# Patient Record
Sex: Female | Born: 1970 | Race: White | Hispanic: No | Marital: Married | Smoking: Never smoker
Health system: Southern US, Community
[De-identification: ages and names within clinical notes are randomized; demographics above are authoritative.]

## PROBLEM LIST (undated history)

## (undated) DIAGNOSIS — F988 Other specified behavioral and emotional disorders with onset usually occurring in childhood and adolescence: Secondary | ICD-10-CM

## (undated) DIAGNOSIS — E559 Vitamin D deficiency, unspecified: Secondary | ICD-10-CM

## (undated) DIAGNOSIS — N816 Rectocele: Secondary | ICD-10-CM

## (undated) HISTORY — PX: CYST EXCISION: SHX5701

## (undated) HISTORY — DX: Other specified behavioral and emotional disorders with onset usually occurring in childhood and adolescence: F98.8

## (undated) HISTORY — DX: Vitamin D deficiency, unspecified: E55.9

## (undated) HISTORY — PX: WISDOM TOOTH EXTRACTION: SHX21

## (undated) HISTORY — DX: Rectocele: N81.6

---

## 1997-10-27 HISTORY — PX: LASIK: SHX215

## 2004-01-12 ENCOUNTER — Other Ambulatory Visit: Admission: RE | Admit: 2004-01-12 | Discharge: 2004-01-12 | Payer: Self-pay | Admitting: Obstetrics and Gynecology

## 2005-02-21 ENCOUNTER — Other Ambulatory Visit: Admission: RE | Admit: 2005-02-21 | Discharge: 2005-02-21 | Payer: Self-pay | Admitting: Obstetrics and Gynecology

## 2005-09-11 ENCOUNTER — Inpatient Hospital Stay (HOSPITAL_COMMUNITY): Admission: AD | Admit: 2005-09-11 | Discharge: 2005-09-13 | Payer: Self-pay | Admitting: Obstetrics and Gynecology

## 2008-07-04 ENCOUNTER — Inpatient Hospital Stay (HOSPITAL_COMMUNITY): Admission: AD | Admit: 2008-07-04 | Discharge: 2008-07-05 | Payer: Self-pay | Admitting: Obstetrics and Gynecology

## 2009-10-29 ENCOUNTER — Inpatient Hospital Stay (HOSPITAL_COMMUNITY): Admission: AD | Admit: 2009-10-29 | Discharge: 2009-10-30 | Payer: Self-pay | Admitting: Obstetrics and Gynecology

## 2011-01-12 LAB — CBC
HCT: 32.2 % — ABNORMAL LOW (ref 36.0–46.0)
Hemoglobin: 10.8 g/dL — ABNORMAL LOW (ref 12.0–15.0)
Hemoglobin: 9.6 g/dL — ABNORMAL LOW (ref 12.0–15.0)
MCV: 85.3 fL (ref 78.0–100.0)
RBC: 3.36 MIL/uL — ABNORMAL LOW (ref 3.87–5.11)
RBC: 3.81 MIL/uL — ABNORMAL LOW (ref 3.87–5.11)
RDW: 14.1 % (ref 11.5–15.5)
WBC: 8.7 10*3/uL (ref 4.0–10.5)
WBC: 8.7 10*3/uL (ref 4.0–10.5)

## 2011-03-11 NOTE — Discharge Summary (Signed)
NAME:  [name], [name]                 ACCOUNT NO.:  [id number]   MEDICAL RECORD NO.:  [id number]          PATIENT TYPE:  INP   LOCATION:  9115                          FACILITY:  [facility]   PHYSICIAN:  Zenaida Niece, M.D.DATE OF BIRTH:  01/23/1971   DATE OF ADMISSION:  07/04/2008  DATE OF DISCHARGE:                               DISCHARGE SUMMARY   ADMISSION DIAGNOSIS:  Intrauterine pregnancy at 40 weeks.   DISCHARGE DIAGNOSIS:  Intrauterine pregnancy at 40 weeks.   PROCEDURES:  On July 04, 2008, she had a spontaneous vaginal  delivery.   HISTORY AND PHYSICAL:  This is a 40 year old gravida 2, para 1-0-0-1  with an EGA of 40 plus weeks who presents for elective induction.  On  admission, she is starting to have regular contractions on her own.  Prenatal care has been uncomplicated.   PRENATAL LABORATORY DATA:  Blood type is O positive with a negative  antibody screen, RPR nonreactive, rubella immune, hepatitis B surface  antigen negative, HIV negative, gonorrhea and chlamydia negative, and  cystic fibrosis negative.  First trimester screen normal.  MSAFP is  normal.  One-hour Glucola is 111.  Group B strep is negative.   PAST OB HISTORY:  In 2006 vaginal delivery at 40 weeks, 8 pounds, no  complications.   PAST MEDICAL HISTORY:  Remote history of depression and scoliosis.   PHYSICAL EXAMINATION:  GENERAL:  She is afebrile with stable vital  signs.  Fetal heart tracing reactive with contractions every 3-5  minutes.  ABDOMEN:  Gravid, nontender with an estimated fetal weight of 8 pounds.  PELVIC:  Cervix is 3+, 15, -2 vertex presentation, adequate pelvis and  membranes were ruptured revealing clear fluid.   HOSPITAL COURSE:  The patient was admitted for induction and was  probably in early labor.  Membranes were ruptured for augmentation.  She  received an epidural, progressed rapidly and within approximately an  hour of rupture of membranes, had a vaginal delivery of a  viable female  infant with Apgars of 8 and 9, weight 7 pounds 9 ounces.  Placenta  delivered spontaneously, was intact and was sent for cord blood  donation.  She had a small second-degree laceration repaired with 3-0  Vicryl and estimated blood loss was less than 500 mL.  Postpartum, she  had no significant complications.  On postpartum #1, she requested  discharge home.  Predelivery hemoglobin 12.1 and postdelivery 10.4.  She  was felt to be stable enough for discharge home.   DISCHARGE INSTRUCTIONS:  Regular diet, pelvic rest, and followup in 6  weeks.  Medications are over-the-counter ibuprofen as needed and she was  given our discharge pamphlet.      Zenaida Niece, M.D.  Electronically Signed     TDM/MEDQ  D:  07/05/2008  T:  07/05/2008  Job:  [id number]

## 2011-03-14 NOTE — Discharge Summary (Signed)
[name] [name], [name] [name]                 ACCOUNT NO.:  [id number]   MEDICAL RECORD NO.:  [id number]          PATIENT TYPE:  INP   LOCATION:  9125                          FACILITY:  [facility]   PHYSICIAN:  Huel Cote, M.D. DATE OF BIRTH:  1971-03-23   DATE OF ADMISSION:  09/11/2005  DATE OF DISCHARGE:  09/13/2005                                 DISCHARGE SUMMARY   DISCHARGE DIAGNOSES:  1.  Term pregnancy at 40+ weeks, delivered.  2.  Status post normal spontaneous vaginal delivery.   DISCHARGE MEDICATIONS:  1.  Motrin 600 mg p.o. every six hours.  2.  Percocet 1-2 tablets p.o. every four hours p.r.n.   DISCHARGE FOLLOWUP:  The patient is to follow up in the office in six weeks  for routine postpartum exam.   HOSPITAL COURSE:  The patient is a 40 year old, G1 P0, who was admitted at  65+ weeks' gestation in early labor.  Cervical exam, on admission, was 180  and -2.  She was admitted and had to wait at maternity admissions for a  room, at which point she received Stadol and received an epidural at that  point.  She progressed well on her own and reached 5-cm of dilation after  arriving to labor and delivery with spontaneous rupture of membranes.  Prenatal care being complicated by advanced maternal age with a normal first  trimester screen.  She did have borderline ventriculomegaly, however, this  was normal on a followup ultrasound.  Prenatal care, otherwise, was  uncomplicated.   PRENATAL LABORATORY:  As follows:  O+.  Antibody negative.  RPR nonreactive.  Rubella immune.  Hepatitis B surface antigen negative.  HIV negative.  GC  negative.  Chlamydia negative.  A 1-hour Glucola 105.  Group B strep  negative.   PAST MEDICAL HISTORY:  1.  She has a history of scoliosis.  2.  Depression.   She had of possible aspirin intolerance and was taking no medications on  arrival.   PHYSICAL EXAMINATION:  VITAL SIGNS:  She was afebrile with stable vital  signs.  PELVIC:  Cervix was  5-to-6, 80, and a 0 station on her first exam by Dr.  Jackelyn Knife at 8:00 a.m.  She continued to progress, eventually reaching  complete dilation and pushed well with a normal spontaneous vaginal delivery  of a viable female infant.  Apgar's were 8 and 9.  Weight was 8 pounds even.  She did have a second-degree midline episiotomy.  Placenta delivered  spontaneous and intact and was sent for cord blood collection.   Her episiotomy was repaired with 3-0 Vicryl with a local block.   She was then admitted for routine postpartum care.  On postpartum day #1,  she was doing very well.  Her hemoglobin was 9.5 down from 11.4.  She then  continued to do well and on postpartum day #2, was felt stable for discharge  home.      Huel Cote, M.D.  Electronically Signed     KR/MEDQ  D:  09/13/2005  T:  09/13/2005  Job:  [id number]

## 2011-05-05 ENCOUNTER — Other Ambulatory Visit (HOSPITAL_COMMUNITY): Payer: Self-pay | Admitting: Obstetrics and Gynecology

## 2011-05-05 DIAGNOSIS — Z1231 Encounter for screening mammogram for malignant neoplasm of breast: Secondary | ICD-10-CM

## 2011-05-09 ENCOUNTER — Ambulatory Visit (HOSPITAL_COMMUNITY)
Admission: RE | Admit: 2011-05-09 | Discharge: 2011-05-09 | Disposition: A | Discharge status: Home / Self Care | Payer: BC Managed Care – PPO | Source: Ambulatory Visit | Attending: Obstetrics and Gynecology | Admitting: Obstetrics and Gynecology

## 2011-05-09 DIAGNOSIS — Z1231 Encounter for screening mammogram for malignant neoplasm of breast: Secondary | ICD-10-CM | POA: Insufficient documentation

## 2011-07-30 LAB — CBC
HCT: 31.2 — ABNORMAL LOW
HCT: 36.4
Hemoglobin: 10.4 — ABNORMAL LOW
MCHC: 33.1
MCHC: 33.4
MCV: 87
Platelets: 215
Platelets: 242
RBC: 4.19
RDW: 13.7
WBC: 8.6

## 2012-11-12 ENCOUNTER — Other Ambulatory Visit: Payer: Self-pay | Admitting: Obstetrics and Gynecology

## 2012-11-12 DIAGNOSIS — N6320 Unspecified lump in the left breast, unspecified quadrant: Secondary | ICD-10-CM

## 2012-11-26 ENCOUNTER — Ambulatory Visit
Admission: RE | Admit: 2012-11-26 | Discharge: 2012-11-26 | Disposition: A | Discharge status: Home / Self Care | Payer: 59 | Source: Ambulatory Visit | Attending: Obstetrics and Gynecology | Admitting: Obstetrics and Gynecology

## 2012-11-26 DIAGNOSIS — N6320 Unspecified lump in the left breast, unspecified quadrant: Secondary | ICD-10-CM

## 2014-02-19 DIAGNOSIS — F988 Other specified behavioral and emotional disorders with onset usually occurring in childhood and adolescence: Secondary | ICD-10-CM | POA: Insufficient documentation

## 2014-02-19 DIAGNOSIS — E559 Vitamin D deficiency, unspecified: Secondary | ICD-10-CM | POA: Insufficient documentation

## 2014-02-20 ENCOUNTER — Ambulatory Visit (INDEPENDENT_AMBULATORY_CARE_PROVIDER_SITE_OTHER): Payer: Managed Care, Other (non HMO) | Admitting: Emergency Medicine

## 2014-02-20 ENCOUNTER — Encounter: Payer: Self-pay | Admitting: Emergency Medicine

## 2014-02-20 VITALS — BP 116/72 | HR 76 | Temp 98.4°F | Resp 18 | Ht 66.0 in | Wt 131.0 lb

## 2014-02-20 DIAGNOSIS — F988 Other specified behavioral and emotional disorders with onset usually occurring in childhood and adolescence: Secondary | ICD-10-CM

## 2014-02-20 MED ORDER — LISDEXAMFETAMINE DIMESYLATE 60 MG PO CAPS: 60.0000 mg | ORAL_CAPSULE | ORAL | Status: DC

## 2014-02-20 NOTE — Progress Notes (Signed)
   Subjective:    Patient ID: [name] [name], female    DOB: August 24, 1971, 43 y.o.   MRN: [id number]  HPI Comments: 43 yo pleasant WF for ADD f/u she notes she doing well overall with medication. She does get headache if does not eat every couple hours with medicine. She notes it helps with focus at work and home. She is sleeping well but still with mild fatigue and notes that may be from stress. She notes mild stress with work and busy family but is managing well. She notes she is not eating healthy and has restarted her DR Samson Frederic. She notes she is overdue for her female exam but did have negative mammo last year with breast tenderness that may have been associated with caffeine.       Medication List       This list is accurate as of: 02/20/14  4:18 PM.  Always use your most recent med list.               lisdexamfetamine 60 MG capsule  Commonly known as:  VYVANSE  Take 60 mg by mouth every morning.       No Known Allergies  Past Medical History  Diagnosis Date  . ADD (attention deficit disorder)   . Vitamin D deficiency     Review of Systems  Constitutional: Positive for fatigue.  All other systems reviewed and are negative.  BP 116/72  Pulse 76  Temp(Src) 98.4 F (36.9 C) (Temporal)  Resp 18  Ht 5\' 6"  (1.676 m)  Wt 131 lb (59.421 kg)  BMI 21.15 kg/m2  LMP 02/06/2014     Objective:   Physical Exam  Nursing note and vitals reviewed. Constitutional: She is oriented to person, place, and time. She appears well-developed and well-nourished. No distress.  HENT:  Head: Normocephalic and atraumatic.  Right Ear: External ear normal.  Left Ear: External ear normal.  Nose: Nose normal.  Mouth/Throat: Oropharynx is clear and moist.  Eyes: Conjunctivae and EOM are normal.  Neck: Normal range of motion. Neck supple. No JVD present. No thyromegaly present.  Cardiovascular: Normal rate, regular rhythm, normal heart sounds and intact distal pulses.   Pulmonary/Chest: Effort  normal and breath sounds normal.  Abdominal: Soft. There is no tenderness.  Musculoskeletal: Normal range of motion. She exhibits no edema and no tenderness.  Lymphadenopathy:    She has no cervical adenopathy.  Neurological: She is alert and oriented to person, place, and time. No cranial nerve deficit.  Skin: Skin is warm and dry. No rash noted. No erythema. No pallor.  Psychiatric: She has a normal mood and affect. Her behavior is normal. Judgment and thought content normal.          Assessment & Plan:  1. ADD- Refill Vyvanse 60 mg continue AD 3 months of RX given.  2. Stress vs Fatigue- declines labs, increase activity and H2O W/C when ready for CBC/ TSH/ CMET, w/c if symptoms increase

## 2014-02-20 NOTE — Patient Instructions (Signed)
Attention Deficit Hyperactivity Disorder Attention deficit hyperactivity disorder (ADHD) is a problem with behavior issues based on the way the brain functions (neurobehavioral disorder). It is a common reason for behavior and academic problems in school. SYMPTOMS  There are 3 types of ADHD. The 3 types and some of the symptoms include:  Inattentive  Gets bored or distracted easily.  Loses or forgets things. Forgets to hand in homework.  Has trouble organizing or completing tasks.  Difficulty staying on task.  An inability to organize daily tasks and school work.  Leaving projects, chores, or homework unfinished.  Trouble paying attention or responding to details. Careless mistakes.  Difficulty following directions. Often seems like is not listening.  Dislikes activities that require sustained attention (like chores or homework).  Hyperactive-impulsive  Feels like it is impossible to sit still or stay in a seat. Fidgeting with hands and feet.  Trouble waiting turn.  Talking too much or out of turn. Interruptive.  Speaks or acts impulsively.  Aggressive, disruptive behavior.  Constantly busy or on the go, noisy.  Often leaves seat when they are expected to remain seated.  Often runs or climbs where it is not appropriate, or feels very restless.  Combined  Has symptoms of both of the above. Often children with ADHD feel discouraged about themselves and with school. They often perform well below their abilities in school. As children get older, the excess motor activities can calm down, but the problems with paying attention and staying organized persist. Most children do not outgrow ADHD but with good treatment can learn to cope with the symptoms. DIAGNOSIS  When ADHD is suspected, the diagnosis should be made by professionals trained in ADHD. This professional will collect information about the individual suspected of having ADHD. Information must be collected from  various settings where the person lives, works, or attends school.  Diagnosis will include:  Confirming symptoms began in childhood.  Ruling out other reasons for the child's behavior.  The health care providers will check with the child's school and check their medical records.  They will talk to teachers and parents.  Behavior rating scales for the child will be filled out by those dealing with the child on a daily basis. A diagnosis is made only after all information has been considered. TREATMENT  Treatment usually includes behavioral treatment, tutoring or extra support in school, and stimulant medicines. Because of the way a person's brain works with ADHD, these medicines decrease impulsivity and hyperactivity and increase attention. This is different than how they would work in a person who does not have ADHD. Other medicines used include antidepressants and certain blood pressure medicines. Most experts agree that treatment for ADHD should address all aspects of the person's functioning. Along with medicines, treatment should include structured classroom management at school. Parents should reward good behavior, provide constant discipline, and limit-setting. Tutoring should be available for the child as needed. ADHD is a life-long condition. If untreated, the disorder can have long-term serious effects into adolescence and adulthood. HOME CARE INSTRUCTIONS   Often with ADHD there is a lot of frustration among family members dealing with the condition. Blame and anger are also feelings that are common. In many cases, because the problem affects the family as a whole, the entire family may need help. A therapist can help the family find better ways to handle the disruptive behaviors of the person with ADHD and promote change. If the person with ADHD is young, most of the therapist's work   is with the parents. Parents will learn techniques for coping with and improving their child's  behavior. Sometimes only the child with the ADHD needs counseling. Your health care providers can help you make these decisions.  Children with ADHD may need help learning how to organize. Some helpful tips include:  Keep routines the same every day from wake-up time to bedtime. Schedule all activities, including homework and playtime. Keep the schedule in a place where the person with ADHD will often see it. Mark schedule changes as far in advance as possible.  Schedule outdoor and indoor recreation.  Have a place for everything and keep everything in its place. This includes clothing, backpacks, and school supplies.  Encourage writing down assignments and bringing home needed books. Work with your child's teachers for assistance in organizing school work.  Offer your child a well-balanced diet. Breakfast that includes a balance of whole grains, protein and, fruits or vegetables is especially important for school performance. Children should avoid drinks with caffeine including:  Soft drinks.  Coffee.  Tea.  However, some older children (adolescents) may find these drinks helpful in improving their attention. Because it can also be common for adolescents with ADHD to become addicted to caffeine, talk with your health care provider about what is a safe amount of caffeine intake for your child.  Children with ADHD need consistent rules that they can understand and follow. If rules are followed, give small rewards. Children with ADHD often receive, and expect, criticism. Look for good behavior and praise it. Set realistic goals. Give clear instructions. Look for activities that can foster success and self-esteem. Make time for pleasant activities with your child. Give lots of affection.  Parents are their children's greatest advocates. Learn as much as possible about ADHD. This helps you become a stronger and better advocate for your child. It also helps you educate your child's teachers and  instructors if they feel inadequate in these areas. Parent support groups are often helpful. A national group with local chapters is called Children and Adults with Attention Deficit Hyperactivity Disorder (CHADD). SEEK MEDICAL CARE IF:  Your child has repeated muscle twitches, cough or speech outbursts.  Your child has sleep problems.  Your child has a marked loss of appetite.  Your child develops depression.  Your child has new or worsening behavioral problems.  Your child develops dizziness.  Your child has a racing heart.  Your child has stomach pains.  Your child develops headaches. SEEK IMMEDIATE MEDICAL CARE IF:  Your child has been diagnosed with depression or anxiety and the symptoms seem to be getting worse.  Your child has been depressed and suddenly appears to have increased energy or motivation.  You are worried that your child is having a bad reaction to a medication he or she is taking for ADHD. Document Released: 10/03/2002 Document Revised: 08/03/2013 Document Reviewed: 06/20/2013 Exit[employer] Patient Information 2014 [employer].  

## 2014-05-10 ENCOUNTER — Other Ambulatory Visit: Payer: Self-pay | Admitting: Emergency Medicine

## 2014-05-10 MED ORDER — LISDEXAMFETAMINE DIMESYLATE 60 MG PO CAPS: 60.0000 mg | ORAL_CAPSULE | ORAL | Status: DC

## 2014-05-10 MED ORDER — LISDEXAMFETAMINE DIMESYLATE 60 MG PO CAPS: 60.0000 mg | ORAL_CAPSULE | ORAL | Status: AC

## 2014-06-26 ENCOUNTER — Telehealth: Payer: Self-pay | Admitting: Internal Medicine

## 2014-06-28 ENCOUNTER — Encounter: Payer: Self-pay | Admitting: Emergency Medicine

## 2014-07-10 NOTE — Telephone Encounter (Signed)
CLOSED

## 2015-02-06 ENCOUNTER — Telehealth: Payer: Self-pay | Admitting: Emergency Medicine

## 2015-02-06 NOTE — Telephone Encounter (Signed)
Patient called with increased Sore throat with blisters/ fever/ aches and failure of old Amoxicillin to treat strep. Quick visit performed with erythematous tonsils with ulcerative lesions right > left. TMs cloudy. Appears sick. Advised will add ZPAK for strep coverage but could also be Influenza. Advised add Theraflu AD OTC. Follow up for full visit if no improvement. Add probiotic to protect stomach. Push fluids. ER if symptoms increase.

## 2016-03-07 DIAGNOSIS — Z6823 Body mass index (BMI) 23.0-23.9, adult: Secondary | ICD-10-CM | POA: Diagnosis not present

## 2016-03-07 DIAGNOSIS — F900 Attention-deficit hyperactivity disorder, predominantly inattentive type: Secondary | ICD-10-CM | POA: Diagnosis not present

## 2016-06-06 DIAGNOSIS — F900 Attention-deficit hyperactivity disorder, predominantly inattentive type: Secondary | ICD-10-CM | POA: Diagnosis not present

## 2016-09-08 DIAGNOSIS — F909 Attention-deficit hyperactivity disorder, unspecified type: Secondary | ICD-10-CM | POA: Diagnosis not present

## 2016-09-08 DIAGNOSIS — Z23 Encounter for immunization: Secondary | ICD-10-CM | POA: Diagnosis not present

## 2016-12-11 DIAGNOSIS — Z13220 Encounter for screening for lipoid disorders: Secondary | ICD-10-CM | POA: Diagnosis not present

## 2016-12-11 DIAGNOSIS — Z0000 Encounter for general adult medical examination without abnormal findings: Secondary | ICD-10-CM | POA: Diagnosis not present

## 2016-12-15 ENCOUNTER — Other Ambulatory Visit: Payer: Self-pay | Admitting: Family Medicine

## 2016-12-15 DIAGNOSIS — Z6824 Body mass index (BMI) 24.0-24.9, adult: Secondary | ICD-10-CM | POA: Diagnosis not present

## 2016-12-15 DIAGNOSIS — Z0000 Encounter for general adult medical examination without abnormal findings: Secondary | ICD-10-CM | POA: Diagnosis not present

## 2016-12-15 DIAGNOSIS — Z23 Encounter for immunization: Secondary | ICD-10-CM | POA: Diagnosis not present

## 2016-12-15 DIAGNOSIS — Z1231 Encounter for screening mammogram for malignant neoplasm of breast: Secondary | ICD-10-CM

## 2016-12-22 ENCOUNTER — Ambulatory Visit: Payer: Self-pay

## 2017-01-05 ENCOUNTER — Ambulatory Visit
Admission: RE | Admit: 2017-01-05 | Discharge: 2017-01-05 | Disposition: A | Discharge status: Home / Self Care | Payer: BLUE CROSS/BLUE SHIELD | Source: Ambulatory Visit | Attending: Family Medicine | Admitting: Family Medicine

## 2017-01-05 DIAGNOSIS — Z1231 Encounter for screening mammogram for malignant neoplasm of breast: Secondary | ICD-10-CM

## 2017-03-16 DIAGNOSIS — Z6825 Body mass index (BMI) 25.0-25.9, adult: Secondary | ICD-10-CM | POA: Diagnosis not present

## 2017-03-16 DIAGNOSIS — F331 Major depressive disorder, recurrent, moderate: Secondary | ICD-10-CM | POA: Diagnosis not present

## 2017-03-16 DIAGNOSIS — F900 Attention-deficit hyperactivity disorder, predominantly inattentive type: Secondary | ICD-10-CM | POA: Diagnosis not present

## 2017-03-16 DIAGNOSIS — F3281 Premenstrual dysphoric disorder: Secondary | ICD-10-CM | POA: Diagnosis not present

## 2017-06-15 DIAGNOSIS — Z23 Encounter for immunization: Secondary | ICD-10-CM | POA: Diagnosis not present

## 2017-06-15 DIAGNOSIS — F900 Attention-deficit hyperactivity disorder, predominantly inattentive type: Secondary | ICD-10-CM | POA: Diagnosis not present

## 2017-07-13 DIAGNOSIS — F4322 Adjustment disorder with anxiety: Secondary | ICD-10-CM | POA: Diagnosis not present

## 2017-07-27 DIAGNOSIS — F4322 Adjustment disorder with anxiety: Secondary | ICD-10-CM | POA: Diagnosis not present

## 2017-08-17 DIAGNOSIS — F4322 Adjustment disorder with anxiety: Secondary | ICD-10-CM | POA: Diagnosis not present

## 2017-09-14 DIAGNOSIS — F4322 Adjustment disorder with anxiety: Secondary | ICD-10-CM | POA: Diagnosis not present

## 2017-09-14 DIAGNOSIS — Z23 Encounter for immunization: Secondary | ICD-10-CM | POA: Diagnosis not present

## 2017-09-14 DIAGNOSIS — F3281 Premenstrual dysphoric disorder: Secondary | ICD-10-CM | POA: Diagnosis not present

## 2017-09-14 DIAGNOSIS — F331 Major depressive disorder, recurrent, moderate: Secondary | ICD-10-CM | POA: Diagnosis not present

## 2017-09-14 DIAGNOSIS — Z6823 Body mass index (BMI) 23.0-23.9, adult: Secondary | ICD-10-CM | POA: Diagnosis not present

## 2017-09-14 DIAGNOSIS — F900 Attention-deficit hyperactivity disorder, predominantly inattentive type: Secondary | ICD-10-CM | POA: Diagnosis not present

## 2017-10-12 DIAGNOSIS — F4322 Adjustment disorder with anxiety: Secondary | ICD-10-CM | POA: Diagnosis not present

## 2017-11-02 DIAGNOSIS — F4322 Adjustment disorder with anxiety: Secondary | ICD-10-CM | POA: Diagnosis not present

## 2017-11-16 DIAGNOSIS — F4322 Adjustment disorder with anxiety: Secondary | ICD-10-CM | POA: Diagnosis not present

## 2017-11-30 DIAGNOSIS — F4322 Adjustment disorder with anxiety: Secondary | ICD-10-CM | POA: Diagnosis not present

## 2017-12-14 DIAGNOSIS — F900 Attention-deficit hyperactivity disorder, predominantly inattentive type: Secondary | ICD-10-CM | POA: Diagnosis not present

## 2017-12-14 DIAGNOSIS — Z6826 Body mass index (BMI) 26.0-26.9, adult: Secondary | ICD-10-CM | POA: Diagnosis not present

## 2017-12-21 DIAGNOSIS — Z13220 Encounter for screening for lipoid disorders: Secondary | ICD-10-CM | POA: Diagnosis not present

## 2017-12-21 DIAGNOSIS — Z1329 Encounter for screening for other suspected endocrine disorder: Secondary | ICD-10-CM | POA: Diagnosis not present

## 2017-12-21 DIAGNOSIS — Z0000 Encounter for general adult medical examination without abnormal findings: Secondary | ICD-10-CM | POA: Diagnosis not present

## 2017-12-21 DIAGNOSIS — Z114 Encounter for screening for human immunodeficiency virus [HIV]: Secondary | ICD-10-CM | POA: Diagnosis not present

## 2017-12-21 DIAGNOSIS — F4322 Adjustment disorder with anxiety: Secondary | ICD-10-CM | POA: Diagnosis not present

## 2017-12-21 DIAGNOSIS — E559 Vitamin D deficiency, unspecified: Secondary | ICD-10-CM | POA: Diagnosis not present

## 2017-12-28 DIAGNOSIS — Z0000 Encounter for general adult medical examination without abnormal findings: Secondary | ICD-10-CM | POA: Diagnosis not present

## 2017-12-28 DIAGNOSIS — Z6825 Body mass index (BMI) 25.0-25.9, adult: Secondary | ICD-10-CM | POA: Diagnosis not present

## 2018-01-04 DIAGNOSIS — F4322 Adjustment disorder with anxiety: Secondary | ICD-10-CM | POA: Diagnosis not present

## 2018-01-07 ENCOUNTER — Other Ambulatory Visit: Payer: Self-pay | Admitting: Family Medicine

## 2018-01-07 DIAGNOSIS — Z1231 Encounter for screening mammogram for malignant neoplasm of breast: Secondary | ICD-10-CM

## 2018-01-18 DIAGNOSIS — F4322 Adjustment disorder with anxiety: Secondary | ICD-10-CM | POA: Diagnosis not present

## 2018-02-01 DIAGNOSIS — F4322 Adjustment disorder with anxiety: Secondary | ICD-10-CM | POA: Diagnosis not present

## 2018-02-15 DIAGNOSIS — F4322 Adjustment disorder with anxiety: Secondary | ICD-10-CM | POA: Diagnosis not present

## 2018-03-01 DIAGNOSIS — F4322 Adjustment disorder with anxiety: Secondary | ICD-10-CM | POA: Diagnosis not present

## 2018-03-15 DIAGNOSIS — F4322 Adjustment disorder with anxiety: Secondary | ICD-10-CM | POA: Diagnosis not present

## 2018-03-29 DIAGNOSIS — F4322 Adjustment disorder with anxiety: Secondary | ICD-10-CM | POA: Diagnosis not present

## 2018-03-29 DIAGNOSIS — F900 Attention-deficit hyperactivity disorder, predominantly inattentive type: Secondary | ICD-10-CM | POA: Diagnosis not present

## 2018-03-29 DIAGNOSIS — Z6826 Body mass index (BMI) 26.0-26.9, adult: Secondary | ICD-10-CM | POA: Diagnosis not present

## 2018-04-19 DIAGNOSIS — F4322 Adjustment disorder with anxiety: Secondary | ICD-10-CM | POA: Diagnosis not present

## 2018-05-10 DIAGNOSIS — F4322 Adjustment disorder with anxiety: Secondary | ICD-10-CM | POA: Diagnosis not present

## 2018-05-24 DIAGNOSIS — F4322 Adjustment disorder with anxiety: Secondary | ICD-10-CM | POA: Diagnosis not present

## 2018-05-31 DIAGNOSIS — E559 Vitamin D deficiency, unspecified: Secondary | ICD-10-CM | POA: Diagnosis not present

## 2018-06-07 DIAGNOSIS — F900 Attention-deficit hyperactivity disorder, predominantly inattentive type: Secondary | ICD-10-CM | POA: Diagnosis not present

## 2018-06-07 DIAGNOSIS — E559 Vitamin D deficiency, unspecified: Secondary | ICD-10-CM | POA: Diagnosis not present

## 2018-06-07 DIAGNOSIS — Z6826 Body mass index (BMI) 26.0-26.9, adult: Secondary | ICD-10-CM | POA: Diagnosis not present

## 2018-06-07 DIAGNOSIS — F4322 Adjustment disorder with anxiety: Secondary | ICD-10-CM | POA: Diagnosis not present

## 2018-06-21 DIAGNOSIS — F4322 Adjustment disorder with anxiety: Secondary | ICD-10-CM | POA: Diagnosis not present

## 2018-07-05 DIAGNOSIS — F4322 Adjustment disorder with anxiety: Secondary | ICD-10-CM | POA: Diagnosis not present

## 2018-07-19 DIAGNOSIS — F4322 Adjustment disorder with anxiety: Secondary | ICD-10-CM | POA: Diagnosis not present

## 2018-08-23 DIAGNOSIS — F4322 Adjustment disorder with anxiety: Secondary | ICD-10-CM | POA: Diagnosis not present

## 2018-09-06 DIAGNOSIS — F4322 Adjustment disorder with anxiety: Secondary | ICD-10-CM | POA: Diagnosis not present

## 2018-09-20 DIAGNOSIS — F4322 Adjustment disorder with anxiety: Secondary | ICD-10-CM | POA: Diagnosis not present

## 2018-10-04 DIAGNOSIS — F4322 Adjustment disorder with anxiety: Secondary | ICD-10-CM | POA: Diagnosis not present

## 2018-10-11 DIAGNOSIS — F4322 Adjustment disorder with anxiety: Secondary | ICD-10-CM | POA: Diagnosis not present

## 2018-11-08 DIAGNOSIS — F4322 Adjustment disorder with anxiety: Secondary | ICD-10-CM | POA: Diagnosis not present

## 2018-11-10 DIAGNOSIS — F900 Attention-deficit hyperactivity disorder, predominantly inattentive type: Secondary | ICD-10-CM | POA: Diagnosis not present

## 2018-11-10 DIAGNOSIS — Z6825 Body mass index (BMI) 25.0-25.9, adult: Secondary | ICD-10-CM | POA: Diagnosis not present

## 2018-11-22 DIAGNOSIS — F4322 Adjustment disorder with anxiety: Secondary | ICD-10-CM | POA: Diagnosis not present

## 2018-12-06 DIAGNOSIS — F4322 Adjustment disorder with anxiety: Secondary | ICD-10-CM | POA: Diagnosis not present

## 2018-12-20 DIAGNOSIS — F4322 Adjustment disorder with anxiety: Secondary | ICD-10-CM | POA: Diagnosis not present

## 2018-12-27 DIAGNOSIS — Z114 Encounter for screening for human immunodeficiency virus [HIV]: Secondary | ICD-10-CM | POA: Diagnosis not present

## 2018-12-27 DIAGNOSIS — Z1329 Encounter for screening for other suspected endocrine disorder: Secondary | ICD-10-CM | POA: Diagnosis not present

## 2018-12-27 DIAGNOSIS — E559 Vitamin D deficiency, unspecified: Secondary | ICD-10-CM | POA: Diagnosis not present

## 2018-12-27 DIAGNOSIS — Z13220 Encounter for screening for lipoid disorders: Secondary | ICD-10-CM | POA: Diagnosis not present

## 2018-12-27 DIAGNOSIS — Z0000 Encounter for general adult medical examination without abnormal findings: Secondary | ICD-10-CM | POA: Diagnosis not present

## 2019-01-03 DIAGNOSIS — Z118 Encounter for screening for other infectious and parasitic diseases: Secondary | ICD-10-CM | POA: Diagnosis not present

## 2019-01-03 DIAGNOSIS — Z01419 Encounter for gynecological examination (general) (routine) without abnormal findings: Secondary | ICD-10-CM | POA: Diagnosis not present

## 2019-01-03 DIAGNOSIS — Z1211 Encounter for screening for malignant neoplasm of colon: Secondary | ICD-10-CM | POA: Diagnosis not present

## 2019-01-03 DIAGNOSIS — Z1151 Encounter for screening for human papillomavirus (HPV): Secondary | ICD-10-CM | POA: Diagnosis not present

## 2019-01-03 DIAGNOSIS — Z6826 Body mass index (BMI) 26.0-26.9, adult: Secondary | ICD-10-CM | POA: Diagnosis not present

## 2019-01-03 DIAGNOSIS — Z113 Encounter for screening for infections with a predominantly sexual mode of transmission: Secondary | ICD-10-CM | POA: Diagnosis not present

## 2019-01-03 DIAGNOSIS — Z0000 Encounter for general adult medical examination without abnormal findings: Secondary | ICD-10-CM | POA: Diagnosis not present

## 2019-01-03 DIAGNOSIS — F4322 Adjustment disorder with anxiety: Secondary | ICD-10-CM | POA: Diagnosis not present

## 2019-01-07 ENCOUNTER — Other Ambulatory Visit: Payer: Self-pay | Admitting: Family Medicine

## 2019-01-07 DIAGNOSIS — R5381 Other malaise: Secondary | ICD-10-CM

## 2019-01-13 ENCOUNTER — Other Ambulatory Visit: Payer: Self-pay | Admitting: Family Medicine

## 2019-01-13 DIAGNOSIS — E2839 Other primary ovarian failure: Secondary | ICD-10-CM

## 2019-01-31 DIAGNOSIS — F4322 Adjustment disorder with anxiety: Secondary | ICD-10-CM | POA: Diagnosis not present

## 2019-02-08 DIAGNOSIS — F900 Attention-deficit hyperactivity disorder, predominantly inattentive type: Secondary | ICD-10-CM | POA: Diagnosis not present

## 2019-02-14 DIAGNOSIS — F4322 Adjustment disorder with anxiety: Secondary | ICD-10-CM | POA: Diagnosis not present

## 2019-02-28 DIAGNOSIS — F4322 Adjustment disorder with anxiety: Secondary | ICD-10-CM | POA: Diagnosis not present

## 2019-03-28 DIAGNOSIS — F4322 Adjustment disorder with anxiety: Secondary | ICD-10-CM | POA: Diagnosis not present

## 2019-04-11 DIAGNOSIS — F4322 Adjustment disorder with anxiety: Secondary | ICD-10-CM | POA: Diagnosis not present

## 2019-04-22 DIAGNOSIS — Z20828 Contact with and (suspected) exposure to other viral communicable diseases: Secondary | ICD-10-CM | POA: Diagnosis not present

## 2019-04-22 DIAGNOSIS — R0602 Shortness of breath: Secondary | ICD-10-CM | POA: Diagnosis not present

## 2019-04-22 DIAGNOSIS — R05 Cough: Secondary | ICD-10-CM | POA: Diagnosis not present

## 2019-05-09 DIAGNOSIS — F4322 Adjustment disorder with anxiety: Secondary | ICD-10-CM | POA: Diagnosis not present

## 2019-05-23 DIAGNOSIS — Z23 Encounter for immunization: Secondary | ICD-10-CM | POA: Diagnosis not present

## 2019-05-23 DIAGNOSIS — F900 Attention-deficit hyperactivity disorder, predominantly inattentive type: Secondary | ICD-10-CM | POA: Diagnosis not present

## 2019-05-23 DIAGNOSIS — E559 Vitamin D deficiency, unspecified: Secondary | ICD-10-CM | POA: Diagnosis not present

## 2019-05-30 DIAGNOSIS — F4322 Adjustment disorder with anxiety: Secondary | ICD-10-CM | POA: Diagnosis not present

## 2019-06-27 DIAGNOSIS — F4322 Adjustment disorder with anxiety: Secondary | ICD-10-CM | POA: Diagnosis not present

## 2019-07-25 DIAGNOSIS — F4322 Adjustment disorder with anxiety: Secondary | ICD-10-CM | POA: Diagnosis not present

## 2019-08-22 DIAGNOSIS — F900 Attention-deficit hyperactivity disorder, predominantly inattentive type: Secondary | ICD-10-CM | POA: Diagnosis not present

## 2019-08-29 DIAGNOSIS — F4322 Adjustment disorder with anxiety: Secondary | ICD-10-CM | POA: Diagnosis not present

## 2019-10-03 DIAGNOSIS — F4322 Adjustment disorder with anxiety: Secondary | ICD-10-CM | POA: Diagnosis not present

## 2019-11-14 ENCOUNTER — Other Ambulatory Visit: Payer: BLUE CROSS/BLUE SHIELD

## 2019-11-21 DIAGNOSIS — F331 Major depressive disorder, recurrent, moderate: Secondary | ICD-10-CM | POA: Diagnosis not present

## 2019-11-21 DIAGNOSIS — Z6824 Body mass index (BMI) 24.0-24.9, adult: Secondary | ICD-10-CM | POA: Diagnosis not present

## 2019-11-21 DIAGNOSIS — E559 Vitamin D deficiency, unspecified: Secondary | ICD-10-CM | POA: Diagnosis not present

## 2019-11-21 DIAGNOSIS — F900 Attention-deficit hyperactivity disorder, predominantly inattentive type: Secondary | ICD-10-CM | POA: Diagnosis not present

## 2020-01-31 DIAGNOSIS — E559 Vitamin D deficiency, unspecified: Secondary | ICD-10-CM | POA: Diagnosis not present

## 2020-01-31 DIAGNOSIS — Z20828 Contact with and (suspected) exposure to other viral communicable diseases: Secondary | ICD-10-CM | POA: Diagnosis not present

## 2020-01-31 DIAGNOSIS — Z0184 Encounter for antibody response examination: Secondary | ICD-10-CM | POA: Diagnosis not present

## 2020-01-31 DIAGNOSIS — Z0000 Encounter for general adult medical examination without abnormal findings: Secondary | ICD-10-CM | POA: Diagnosis not present

## 2020-02-02 DIAGNOSIS — Z0000 Encounter for general adult medical examination without abnormal findings: Secondary | ICD-10-CM | POA: Diagnosis not present

## 2020-02-02 DIAGNOSIS — Z1211 Encounter for screening for malignant neoplasm of colon: Secondary | ICD-10-CM | POA: Diagnosis not present

## 2020-02-02 DIAGNOSIS — Z23 Encounter for immunization: Secondary | ICD-10-CM | POA: Diagnosis not present

## 2020-02-02 DIAGNOSIS — Z6826 Body mass index (BMI) 26.0-26.9, adult: Secondary | ICD-10-CM | POA: Diagnosis not present

## 2020-02-20 DIAGNOSIS — F900 Attention-deficit hyperactivity disorder, predominantly inattentive type: Secondary | ICD-10-CM | POA: Diagnosis not present

## 2020-03-23 ENCOUNTER — Encounter: Payer: Self-pay | Admitting: Gastroenterology

## 2020-04-12 DIAGNOSIS — Z23 Encounter for immunization: Secondary | ICD-10-CM | POA: Diagnosis not present

## 2020-05-21 ENCOUNTER — Other Ambulatory Visit: Payer: Self-pay

## 2020-05-21 ENCOUNTER — Ambulatory Visit (AMBULATORY_SURGERY_CENTER): Payer: Self-pay | Admitting: *Deleted

## 2020-05-21 VITALS — Ht 66.0 in | Wt 156.2 lb

## 2020-05-21 DIAGNOSIS — Z1211 Encounter for screening for malignant neoplasm of colon: Secondary | ICD-10-CM

## 2020-05-21 MED ORDER — SUTAB 1479-225-188 MG PO TABS: 1.0000 | ORAL_TABLET | Freq: Once | ORAL | 0 refills | Status: AC

## 2020-05-21 NOTE — Progress Notes (Signed)
covid vaccines completed 12-07-19  Pt is aware that care partner will wait in the car during procedure; if they feel like they will be too hot or cold to wait in the car; they may wait in the 4 th floor lobby. Patient is aware to bring only one care partner. We want them to wear a mask (we do not have any that we can provide them), practice social distancing, and we will check their temperatures when they get here.  I did remind the patient that their care partner needs to stay in the parking lot the entire time and have a cell phone available, we will call them when the pt is ready for discharge. Patient will wear mask into building.   No trouble with anesthesia, difficulty with moving neck or hx/fam hx of malignant hyperthermia per pt   No egg or soy allergy  No home oxygen use   No medications for weight loss taken  emmi information given  Pt denies constipation issues   Sutab code put into RX and paper copy given to pt to show pharmacy

## 2020-05-22 ENCOUNTER — Encounter: Payer: Self-pay | Admitting: Gastroenterology

## 2020-05-28 DIAGNOSIS — F988 Other specified behavioral and emotional disorders with onset usually occurring in childhood and adolescence: Secondary | ICD-10-CM | POA: Diagnosis not present

## 2020-05-28 DIAGNOSIS — F4321 Adjustment disorder with depressed mood: Secondary | ICD-10-CM | POA: Diagnosis not present

## 2020-05-28 DIAGNOSIS — G4700 Insomnia, unspecified: Secondary | ICD-10-CM | POA: Diagnosis not present

## 2020-06-03 ENCOUNTER — Encounter: Payer: Self-pay | Admitting: Certified Registered Nurse Anesthetist

## 2020-06-04 ENCOUNTER — Other Ambulatory Visit: Payer: Self-pay

## 2020-06-04 ENCOUNTER — Ambulatory Visit (AMBULATORY_SURGERY_CENTER): Payer: BC Managed Care – PPO | Admitting: Gastroenterology

## 2020-06-04 ENCOUNTER — Encounter: Payer: Self-pay | Admitting: Gastroenterology

## 2020-06-04 VITALS — BP 122/72 | HR 54 | Temp 96.9°F | Resp 16 | Ht 66.0 in | Wt 156.2 lb

## 2020-06-04 DIAGNOSIS — Z1211 Encounter for screening for malignant neoplasm of colon: Secondary | ICD-10-CM | POA: Diagnosis not present

## 2020-06-04 DIAGNOSIS — D122 Benign neoplasm of ascending colon: Secondary | ICD-10-CM | POA: Diagnosis not present

## 2020-06-04 DIAGNOSIS — D125 Benign neoplasm of sigmoid colon: Secondary | ICD-10-CM | POA: Diagnosis not present

## 2020-06-04 MED ORDER — SODIUM CHLORIDE 0.9 % IV SOLN: 500.0000 mL | Freq: Once | INTRAVENOUS | Status: DC

## 2020-06-04 NOTE — Progress Notes (Signed)
Pt's states no medical or surgical changes since previsit or office visit.  Front desk-LR VS-CW/TB

## 2020-06-04 NOTE — Patient Instructions (Signed)
YOU HAD AN ENDOSCOPIC PROCEDURE TODAY AT [facility]:   Refer to the procedure report that was given to you for any specific questions about what was found during the examination.  If the procedure report does not answer your questions, please call your gastroenterologist to clarify.  If you requested that your care partner not be given the details of your procedure findings, then the procedure report has been included in a sealed envelope for you to review at your convenience later.  YOU SHOULD EXPECT: Some feelings of bloating in the abdomen. Passage of more gas than usual.  Walking can help get rid of the air that was put into your GI tract during the procedure and reduce the bloating. If you had a lower endoscopy (such as a colonoscopy or flexible sigmoidoscopy) you may notice spotting of blood in your stool or on the toilet paper. If you underwent a bowel prep for your procedure, you may not have a normal bowel movement for a few days.  **Handout given on Polyps**  Please Note:  You might notice some irritation and congestion in your nose or some drainage.  This is from the oxygen used during your procedure.  There is no need for concern and it should clear up in a day or so.  SYMPTOMS TO REPORT IMMEDIATELY:   Following lower endoscopy (colonoscopy or flexible sigmoidoscopy):  Excessive amounts of blood in the stool  Significant tenderness or worsening of abdominal pains  Swelling of the abdomen that is new, acute  Fever of 100F or higher   For urgent or emergent issues, a gastroenterologist can be reached at any hour by calling [phone]. Do not use MyChart messaging for urgent concerns.    DIET:  We do recommend a small meal at first, but then you may proceed to your regular diet.  Drink plenty of fluids but you should avoid alcoholic beverages for 24 hours.  ACTIVITY:  You should plan to take it easy for the rest of today and you should NOT DRIVE or use heavy  machinery until tomorrow (because of the sedation medicines used during the test).    FOLLOW UP: Our staff will call the number listed on your records 48-72 hours following your procedure to check on you and address any questions or concerns that you may have regarding the information given to you following your procedure. If we do not reach you, we will leave a message.  We will attempt to reach you two times.  During this call, we will ask if you have developed any symptoms of COVID 19. If you develop any symptoms (ie: fever, flu-like symptoms, shortness of breath, cough etc.) before then, please call [phone].  If you test positive for Covid 19 in the 2 weeks post procedure, please call and report this information to [place].    If any biopsies were taken you will be contacted by phone or by letter within the next 1-3 weeks.  Please call us at [phone] if you have not heard about the biopsies in 3 weeks.    SIGNATURES/CONFIDENTIALITY: You and/or your care partner have signed paperwork which will be entered into your electronic medical record.  These signatures attest to the fact that that the information above on your After Visit Summary has been reviewed and is understood.  Full responsibility of the confidentiality of this discharge information lies with you and/or your care-partner.

## 2020-06-04 NOTE — Op Note (Signed)
[place] Patient Name: [name] [name] Procedure Date: 06/04/2020 9:03 AM MRN: [id number] Endoscopist: Mauri Pole , MD Age: 49 Referring MD:  Date of Birth: 08/30/1971 Gender: Female Account #: [id number] Procedure:                Colonoscopy Indications:              Screening for colorectal malignant neoplasm Medicines:                Monitored Anesthesia Care Procedure:                Pre-Anesthesia Assessment:                           - Prior to the procedure, a History and Physical                            was performed, and patient medications and                            allergies were reviewed. The patient's tolerance of                            previous anesthesia was also reviewed. The risks                            and benefits of the procedure and the sedation                            options and risks were discussed with the patient.                            All questions were answered, and informed consent                            was obtained. Prior Anticoagulants: The patient has                            taken no previous anticoagulant or antiplatelet                            agents. ASA Grade Assessment: II - A patient with                            mild systemic disease. After reviewing the risks                            and benefits, the patient was deemed in                            satisfactory condition to undergo the procedure.                           After obtaining informed consent, the colonoscope  was passed under direct vision. Throughout the                            procedure, the patient's blood pressure, pulse, and                            oxygen saturations were monitored continuously. The                            Colonoscope was introduced through the anus and                            advanced to the the cecum, identified by                            appendiceal orifice and  ileocecal valve. The                            colonoscopy was performed without difficulty. The                            patient tolerated the procedure well. The quality                            of the bowel preparation was good. The ileocecal                            valve, appendiceal orifice, and rectum were                            photographed. Scope In: 9:11:07 AM Scope Out: 9:35:01 AM Scope Withdrawal Time: 0 hours 12 minutes 35 seconds  Total Procedure Duration: 0 hours 23 minutes 54 seconds  Findings:                 The perianal and digital rectal examinations were                            normal.                           A 7 mm polyp was found in the ascending colon. The                            polyp was sessile. The polyp was removed with a                            cold snare. Resection and retrieval were complete.                           A 1 mm polyp was found in the sigmoid colon. The                            polyp was sessile. The polyp was removed with a  cold biopsy forceps. Resection and retrieval were                            complete.                           Scattered small and large-mouthed diverticula were                            found in the sigmoid colon, descending colon and                            ascending colon.                           Non-bleeding internal hemorrhoids were found during                            retroflexion. The hemorrhoids were small. Complications:            No immediate complications. Estimated Blood Loss:     Estimated blood loss was minimal. Impression:               - One 7 mm polyp in the ascending colon, removed                            with a cold snare. Resected and retrieved.                           - One 1 mm polyp in the sigmoid colon, removed with                            a cold biopsy forceps. Resected and retrieved.                           - Mild  diverticulosis in the sigmoid colon, in the                            descending colon and in the ascending colon.                           - Non-bleeding internal hemorrhoids. Recommendation:           - Patient has a contact number available for                            emergencies. The signs and symptoms of potential                            delayed complications were discussed with the                            patient. Return to normal activities tomorrow.                            Written discharge  instructions were provided to the                            patient.                           - Resume previous diet.                           - Continue present medications.                           - Await pathology results.                           - Repeat colonoscopy in 5-10 years for surveillance                            based on pathology results. Mauri Pole, MD 06/04/2020 9:39:47 AM This report has been signed electronically.

## 2020-06-04 NOTE — Progress Notes (Signed)
Called to room to assist during endoscopic procedure.  Patient ID and intended procedure confirmed with present staff. Received instructions for my participation in the procedure from the performing physician.  

## 2020-06-04 NOTE — Progress Notes (Signed)
Report given to PACU, vss 

## 2020-06-06 ENCOUNTER — Telehealth: Payer: Self-pay | Admitting: *Deleted

## 2020-06-06 NOTE — Telephone Encounter (Signed)
  Follow up Call-  Call back number 06/04/2020  Post procedure Call Back phone  # [phone]  Permission to leave phone message Yes  Some recent data might be hidden     Patient questions:  Do you have a fever, pain , or abdominal swelling? No. Pain Score  0 *  Have you tolerated food without any problems? Yes.    Have you been able to return to your normal activities? Yes.    Do you have any questions about your discharge instructions: Diet   No. Medications  No. Follow up visit  No.  Do you have questions or concerns about your Care? No.  Actions: * If pain score is 4 or above: No action needed, pain <4  1. Have you developed a fever since your procedure? NO  2.   Have you had an respiratory symptoms (SOB or cough) since your procedure? NO  3.   Have you tested positive for COVID 19 since your procedure NO  4.   Have you had any family members/close contacts diagnosed with the COVID 19 since your procedure?  NO   If yes to any of these questions please route to Joylene John, RN and Joella Prince, RN

## 2020-06-15 ENCOUNTER — Encounter: Payer: Self-pay | Admitting: Gastroenterology

## 2020-06-18 DIAGNOSIS — Z23 Encounter for immunization: Secondary | ICD-10-CM | POA: Diagnosis not present

## 2020-07-30 DIAGNOSIS — F411 Generalized anxiety disorder: Secondary | ICD-10-CM | POA: Diagnosis not present

## 2020-07-30 DIAGNOSIS — F331 Major depressive disorder, recurrent, moderate: Secondary | ICD-10-CM | POA: Diagnosis not present

## 2020-07-30 DIAGNOSIS — F988 Other specified behavioral and emotional disorders with onset usually occurring in childhood and adolescence: Secondary | ICD-10-CM | POA: Diagnosis not present

## 2020-07-30 DIAGNOSIS — G4700 Insomnia, unspecified: Secondary | ICD-10-CM | POA: Diagnosis not present

## 2020-09-03 DIAGNOSIS — G4700 Insomnia, unspecified: Secondary | ICD-10-CM | POA: Diagnosis not present

## 2020-09-03 DIAGNOSIS — F331 Major depressive disorder, recurrent, moderate: Secondary | ICD-10-CM | POA: Diagnosis not present

## 2020-09-03 DIAGNOSIS — F988 Other specified behavioral and emotional disorders with onset usually occurring in childhood and adolescence: Secondary | ICD-10-CM | POA: Diagnosis not present

## 2020-09-03 DIAGNOSIS — F411 Generalized anxiety disorder: Secondary | ICD-10-CM | POA: Diagnosis not present

## 2020-12-31 DIAGNOSIS — F331 Major depressive disorder, recurrent, moderate: Secondary | ICD-10-CM | POA: Diagnosis not present

## 2020-12-31 DIAGNOSIS — F988 Other specified behavioral and emotional disorders with onset usually occurring in childhood and adolescence: Secondary | ICD-10-CM | POA: Diagnosis not present

## 2020-12-31 DIAGNOSIS — F411 Generalized anxiety disorder: Secondary | ICD-10-CM | POA: Diagnosis not present

## 2020-12-31 DIAGNOSIS — G4700 Insomnia, unspecified: Secondary | ICD-10-CM | POA: Diagnosis not present

## 2021-01-28 DIAGNOSIS — F988 Other specified behavioral and emotional disorders with onset usually occurring in childhood and adolescence: Secondary | ICD-10-CM | POA: Diagnosis not present

## 2021-02-25 DIAGNOSIS — G4700 Insomnia, unspecified: Secondary | ICD-10-CM | POA: Diagnosis not present

## 2021-02-25 DIAGNOSIS — F988 Other specified behavioral and emotional disorders with onset usually occurring in childhood and adolescence: Secondary | ICD-10-CM | POA: Diagnosis not present

## 2021-02-25 DIAGNOSIS — Z0000 Encounter for general adult medical examination without abnormal findings: Secondary | ICD-10-CM | POA: Diagnosis not present

## 2021-02-25 DIAGNOSIS — F411 Generalized anxiety disorder: Secondary | ICD-10-CM | POA: Diagnosis not present

## 2021-02-25 DIAGNOSIS — F331 Major depressive disorder, recurrent, moderate: Secondary | ICD-10-CM | POA: Diagnosis not present

## 2021-02-26 ENCOUNTER — Other Ambulatory Visit: Payer: Self-pay | Admitting: Family Medicine

## 2021-02-26 DIAGNOSIS — Z0000 Encounter for general adult medical examination without abnormal findings: Secondary | ICD-10-CM | POA: Diagnosis not present

## 2021-02-26 DIAGNOSIS — Z01419 Encounter for gynecological examination (general) (routine) without abnormal findings: Secondary | ICD-10-CM | POA: Diagnosis not present

## 2021-02-26 DIAGNOSIS — Z118 Encounter for screening for other infectious and parasitic diseases: Secondary | ICD-10-CM | POA: Diagnosis not present

## 2021-02-26 DIAGNOSIS — Z1231 Encounter for screening mammogram for malignant neoplasm of breast: Secondary | ICD-10-CM

## 2021-02-26 DIAGNOSIS — R030 Elevated blood-pressure reading, without diagnosis of hypertension: Secondary | ICD-10-CM | POA: Diagnosis not present

## 2021-02-26 DIAGNOSIS — Z23 Encounter for immunization: Secondary | ICD-10-CM | POA: Diagnosis not present

## 2021-04-24 ENCOUNTER — Other Ambulatory Visit: Payer: Self-pay

## 2021-04-24 ENCOUNTER — Ambulatory Visit
Admission: RE | Admit: 2021-04-24 | Discharge: 2021-04-24 | Disposition: A | Discharge status: Home / Self Care | Payer: BC Managed Care – PPO | Source: Ambulatory Visit | Attending: Family Medicine | Admitting: Family Medicine

## 2021-04-24 DIAGNOSIS — Z1231 Encounter for screening mammogram for malignant neoplasm of breast: Secondary | ICD-10-CM | POA: Diagnosis not present

## 2021-05-27 DIAGNOSIS — F988 Other specified behavioral and emotional disorders with onset usually occurring in childhood and adolescence: Secondary | ICD-10-CM | POA: Diagnosis not present

## 2021-05-27 DIAGNOSIS — N926 Irregular menstruation, unspecified: Secondary | ICD-10-CM | POA: Diagnosis not present

## 2021-05-27 DIAGNOSIS — I10 Essential (primary) hypertension: Secondary | ICD-10-CM | POA: Diagnosis not present

## 2021-05-27 DIAGNOSIS — F3281 Premenstrual dysphoric disorder: Secondary | ICD-10-CM | POA: Diagnosis not present

## 2021-08-27 DIAGNOSIS — F988 Other specified behavioral and emotional disorders with onset usually occurring in childhood and adolescence: Secondary | ICD-10-CM | POA: Diagnosis not present

## 2021-08-27 DIAGNOSIS — Z23 Encounter for immunization: Secondary | ICD-10-CM | POA: Diagnosis not present

## 2021-08-27 DIAGNOSIS — F411 Generalized anxiety disorder: Secondary | ICD-10-CM | POA: Diagnosis not present

## 2021-08-27 DIAGNOSIS — E559 Vitamin D deficiency, unspecified: Secondary | ICD-10-CM | POA: Diagnosis not present

## 2021-08-27 DIAGNOSIS — F331 Major depressive disorder, recurrent, moderate: Secondary | ICD-10-CM | POA: Diagnosis not present

## 2021-11-04 ENCOUNTER — Encounter (HOSPITAL_BASED_OUTPATIENT_CLINIC_OR_DEPARTMENT_OTHER): Payer: Self-pay | Admitting: Emergency Medicine

## 2021-11-04 ENCOUNTER — Emergency Department (HOSPITAL_BASED_OUTPATIENT_CLINIC_OR_DEPARTMENT_OTHER)
Admission: EM | Admit: 2021-11-04 | Discharge: 2021-11-04 | Disposition: A | Discharge status: Home / Self Care | Payer: BC Managed Care – PPO | Attending: Emergency Medicine | Admitting: Emergency Medicine

## 2021-11-04 ENCOUNTER — Other Ambulatory Visit: Payer: Self-pay

## 2021-11-04 ENCOUNTER — Emergency Department (HOSPITAL_BASED_OUTPATIENT_CLINIC_OR_DEPARTMENT_OTHER): Payer: BC Managed Care – PPO

## 2021-11-04 DIAGNOSIS — R0789 Other chest pain: Secondary | ICD-10-CM | POA: Diagnosis not present

## 2021-11-04 DIAGNOSIS — R079 Chest pain, unspecified: Secondary | ICD-10-CM | POA: Diagnosis not present

## 2021-11-04 DIAGNOSIS — R0602 Shortness of breath: Secondary | ICD-10-CM | POA: Diagnosis not present

## 2021-11-04 DIAGNOSIS — J9811 Atelectasis: Secondary | ICD-10-CM | POA: Diagnosis not present

## 2021-11-04 DIAGNOSIS — I493 Ventricular premature depolarization: Secondary | ICD-10-CM | POA: Diagnosis not present

## 2021-11-04 LAB — BASIC METABOLIC PANEL
Anion gap: 8 (ref 5–15)
BUN: 14 mg/dL (ref 6–20)
CO2: 26 mmol/L (ref 22–32)
Calcium: 9.4 mg/dL (ref 8.9–10.3)
Chloride: 105 mmol/L (ref 98–111)
Creatinine, Ser: 0.74 mg/dL (ref 0.44–1.00)
GFR, Estimated: >60 mL/min (ref >60)
Glucose, Bld: 99 mg/dL (ref 70–99)
Potassium: 4 mmol/L (ref 3.5–5.1)
Sodium: 139 mmol/L (ref 135–145)

## 2021-11-04 LAB — TROPONIN I (HIGH SENSITIVITY)
Troponin I (High Sensitivity): 3 ng/L (ref <18)
Troponin I (High Sensitivity): 2 ng/L (ref <18)

## 2021-11-04 LAB — CBC
HCT: 44.5 % (ref 36.0–46.0)
Hemoglobin: 15.2 g/dL — ABNORMAL HIGH (ref 12.0–15.0)
MCH: 29 pg (ref 26.0–34.0)
MCHC: 34.2 g/dL (ref 30.0–36.0)
MCV: 84.9 fL (ref 80.0–100.0)
Platelets: 309 10*3/uL (ref 150–400)
RBC: 5.24 MIL/uL — ABNORMAL HIGH (ref 3.87–5.11)
RDW: 12.5 % (ref 11.5–15.5)
WBC: 5.6 10*3/uL (ref 4.0–10.5)
nRBC: 0 % (ref 0.0–0.2)

## 2021-11-04 MED ORDER — METOPROLOL SUCCINATE ER 25 MG PO TB24: 25.0000 mg | ORAL_TABLET | Freq: Every day | ORAL | 0 refills | Status: AC

## 2021-11-04 NOTE — ED Triage Notes (Signed)
Chest pain since last night.  Some sob.  Pt having frequent PVC's on the monitor.

## 2021-11-04 NOTE — ED Provider Notes (Signed)
[facility] EMERGENCY DEPARTMENT Provider Note   CSN: [id number] Arrival date & time: 11/04/21  8466     History  Chief Complaint  Patient presents with   Chest Pain    [name] [name] is a 51 y.o. female.  Patient is a 51 year old female who presents with chest discomfort.  She said that she has been having some GERD symptoms intermittently for the last few months.  That normally occurs in the afternoon and when she drinks more caffeine.  Since yesterday she has been having a funny feeling in her chest.  She does not really describe it as a pain or discomfort.  She said its been since yesterday.  She feels a little short of breath when it happens.  There is no exertional symptoms.  No pleuritic symptoms.  Its not worse after eating or coughing.  No recent cough or congestion.  No fevers.  No known history of heart problems.  She does take Vyvanse for ADHD.  She also says she drinks a lot of caffeine.  At least 1, sometimes more Dr. Samson Frederic per day.  She denies any leg pain or swelling.  She has an external cardiac monitor 1-lead Jodelle Red) that was reporting yesterday frequent PVCs and possibly atrial fibrillation.      Home Medications Prior to Admission medications   Medication Sig Start Date End Date Taking? Authorizing Provider  metoprolol succinate (TOPROL-XL) 25 MG 24 hr tablet Take 1 tablet (25 mg total) by mouth daily. 11/04/21  Yes Malvin Johns, MD  IBUPROFEN PO Take by mouth as needed.    [provider]  lisdexamfetamine (VYVANSE) 60 MG capsule Take 1 capsule (60 mg total) by mouth every morning. Patient taking differently: Take 60 mg by mouth every morning. Takes 40 mg in the am, 20 mg in the pm 05/22/14   Kelby Aline, PA-C  VITAMIN D PO Take by mouth. 4000 units 3 times a week    [provider]      Allergies    Patient has no known allergies.    Review of Systems   Review of Systems  Constitutional:  Negative for chills, diaphoresis,  fatigue and fever.  HENT:  Negative for congestion, rhinorrhea and sneezing.   Eyes: Negative.   Respiratory:  Negative for cough, chest tightness and shortness of breath.   Cardiovascular:  Positive for chest pain and palpitations. Negative for leg swelling.  Gastrointestinal:  Negative for abdominal pain, blood in stool, diarrhea, nausea and vomiting.  Genitourinary:  Negative for difficulty urinating, flank pain, frequency and hematuria.  Musculoskeletal:  Negative for arthralgias and back pain.  Skin:  Negative for rash.  Neurological:  Negative for dizziness, speech difficulty, weakness, numbness and headaches.   Physical Exam Updated Vital Signs BP 114/71    Pulse 76    Temp 97.7 F (36.5 C) (Oral)    Resp (!) 21    Ht 5\' 7"  (1.702 m)    Wt 73.9 kg    LMP 12/12/2016    SpO2 100%    BMI 25.53 kg/m  Physical Exam Constitutional:      Appearance: She is well-developed.  HENT:     Head: Normocephalic and atraumatic.  Eyes:     Pupils: Pupils are equal, round, and reactive to light.  Cardiovascular:     Rate and Rhythm: Normal rate and regular rhythm.     Heart sounds: Normal heart sounds.  Pulmonary:     Effort: Pulmonary effort is normal. No  respiratory distress.     Breath sounds: Normal breath sounds. No wheezing or rales.  Chest:     Chest wall: No tenderness.  Abdominal:     General: Bowel sounds are normal.     Palpations: Abdomen is soft.     Tenderness: There is no abdominal tenderness. There is no guarding or rebound.  Musculoskeletal:        General: Normal range of motion.     Cervical back: Normal range of motion and neck supple.     Comments: No edema or calf tenderness  Lymphadenopathy:     Cervical: No cervical adenopathy.  Skin:    General: Skin is warm and dry.     Findings: No rash.  Neurological:     Mental Status: She is alert and oriented to person, place, and time.    ED Results / Procedures / Treatments   Labs (all labs ordered are listed,  but only abnormal results are displayed) Labs Reviewed  CBC - Abnormal; Notable for the following components:      Result Value   RBC 5.24 (*)    Hemoglobin 15.2 (*)    All other components within normal limits  BASIC METABOLIC PANEL  PREGNANCY, URINE  TROPONIN I (HIGH SENSITIVITY)  TROPONIN I (HIGH SENSITIVITY)    EKG EKG Interpretation  Date/Time:  Monday November 04 2021 08:32:26 EST Ventricular Rate:  81 PR Interval:  146 QRS Duration: 86 QT Interval:  375 QTC Calculation: 436 R Axis:   24 Text Interpretation: Sinus rhythm Probable left atrial enlargement Borderline T abnormalities, anterior leads No old tracing to compare Confirmed by Malvin Johns [phone]) on 11/04/2021 8:37:01 AM  Radiology DG Chest 2 View  Result Date: 11/04/2021 CLINICAL DATA:  Chest pain since last night, shortness of breath, frequent PVCs EXAM: CHEST - 2 VIEW COMPARISON:  None FINDINGS: Normal heart size, mediastinal contours, and pulmonary vascularity. Minimal LEFT basilar atelectasis. Lungs otherwise clear. No acute infiltrate, pleural effusion, or pneumothorax. Biconvex thoracic scoliosis. IMPRESSION: Minimal LEFT basilar atelectasis. Scoliosis. Electronically Signed   By: Lavonia Dana M.D.   On: 11/04/2021 08:58    Procedures Procedures    Medications Ordered in ED Medications - No data to display  ED Course/ Medical Decision Making/ A&P                           Medical Decision Making  Patient is a 51 year old female who presents with chest discomfort.  She actually says it just feels like a funny feeling in her chest without any real discomfort.  She had a recurrence of the symptoms while I was watching the monitor and she was having some frequent PVCs.  This is reassuring that it potentially is the palpitations causing her symptoms.  Her EKG does not show any ischemic changes.  No arrhythmias.  She does have frequent intermittent PVCs on the monitor.  No other arrhythmias were identified.  Her  EKG was interpreted by me.  Her chest x-ray showed no acute abnormalities.  This was interpreted by me.  Her labs are nonconcerning.  Her troponins x2 were negative.  No other suggestions of ACS.  No exertional symptoms.  No evidence of pneumonia or pneumothorax on x-ray.  No clinical symptoms that be more suggestive of dissection.  I reviewed her records.  She is on Vyvanse.  I had a discussion with the patient about her caffeine intake.  This could be contributing to her  more frequent PVCs.  She will start cutting back the Dr. Samson Frederic and potentially come off the Vyvanse for a few days.  I did discuss prescribing her metoprolol for symptomatic relief.  She is amenable to this.  She was given a prescription for 25 mg of metoprolol.  I did encourage her to have close follow-up with cardiology or her PCP.  She was given a referral to the cardiologist here at med center.  Return precautions were given.  Final Clinical Impression(s) / ED Diagnoses Final diagnoses:  Nonspecific chest pain  PVC's (premature ventricular contractions)    Rx / DC Orders ED Discharge Orders          Ordered    metoprolol succinate (TOPROL-XL) 25 MG 24 hr tablet  Daily        11/04/21 1202              Malvin Johns, MD 11/04/21 1206

## 2021-11-25 DIAGNOSIS — F988 Other specified behavioral and emotional disorders with onset usually occurring in childhood and adolescence: Secondary | ICD-10-CM | POA: Diagnosis not present

## 2021-11-25 DIAGNOSIS — I493 Ventricular premature depolarization: Secondary | ICD-10-CM | POA: Diagnosis not present

## 2021-11-25 DIAGNOSIS — R079 Chest pain, unspecified: Secondary | ICD-10-CM | POA: Diagnosis not present

## 2021-12-23 DIAGNOSIS — F988 Other specified behavioral and emotional disorders with onset usually occurring in childhood and adolescence: Secondary | ICD-10-CM | POA: Diagnosis not present

## 2022-02-24 DIAGNOSIS — F988 Other specified behavioral and emotional disorders with onset usually occurring in childhood and adolescence: Secondary | ICD-10-CM | POA: Diagnosis not present

## 2022-02-24 DIAGNOSIS — G4700 Insomnia, unspecified: Secondary | ICD-10-CM | POA: Diagnosis not present

## 2022-11-03 DIAGNOSIS — N951 Menopausal and female climacteric states: Secondary | ICD-10-CM | POA: Diagnosis not present

## 2022-11-03 DIAGNOSIS — R030 Elevated blood-pressure reading, without diagnosis of hypertension: Secondary | ICD-10-CM | POA: Diagnosis not present

## 2022-11-03 DIAGNOSIS — F909 Attention-deficit hyperactivity disorder, unspecified type: Secondary | ICD-10-CM | POA: Diagnosis not present

## 2022-11-03 DIAGNOSIS — Z0001 Encounter for general adult medical examination with abnormal findings: Secondary | ICD-10-CM | POA: Diagnosis not present

## 2022-11-03 DIAGNOSIS — Z23 Encounter for immunization: Secondary | ICD-10-CM | POA: Diagnosis not present

## 2022-11-03 DIAGNOSIS — F411 Generalized anxiety disorder: Secondary | ICD-10-CM | POA: Diagnosis not present

## 2022-12-08 DIAGNOSIS — N951 Menopausal and female climacteric states: Secondary | ICD-10-CM | POA: Diagnosis not present

## 2022-12-08 DIAGNOSIS — F909 Attention-deficit hyperactivity disorder, unspecified type: Secondary | ICD-10-CM | POA: Diagnosis not present

## 2022-12-08 DIAGNOSIS — R030 Elevated blood-pressure reading, without diagnosis of hypertension: Secondary | ICD-10-CM | POA: Diagnosis not present

## 2022-12-28 IMAGING — MG MM DIGITAL SCREENING BILAT W/ TOMO AND CAD
6 of 10 series · 6 of 30 positions shown · non-contrast
Comparison: Previous exam(s).

CLINICAL DATA: Screening.

EXAM:
DIGITAL SCREENING BILATERAL MAMMOGRAM WITH TOMOSYNTHESIS AND CAD
TECHNIQUE: Bilateral screening digital craniocaudal and mediolateral oblique
mammograms were obtained. Bilateral screening digital breast
tomosynthesis was performed. The images were evaluated with
computer-aided detection.

[R MLO synth-2D]
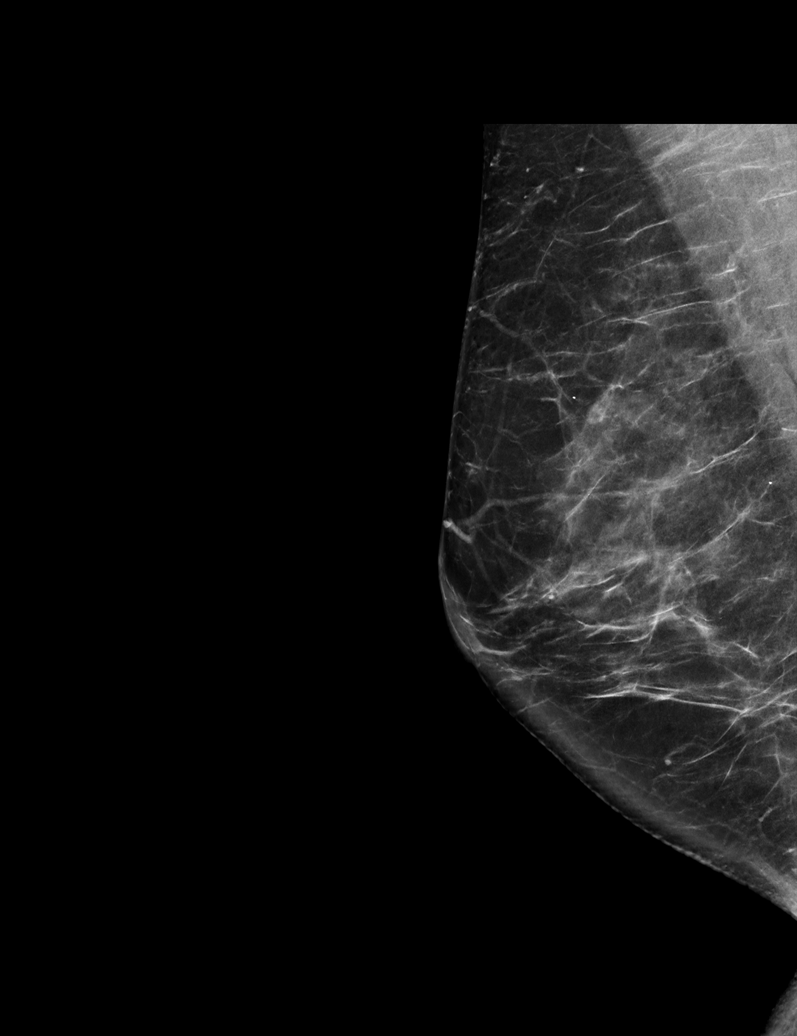

[L MLO synth-2D]
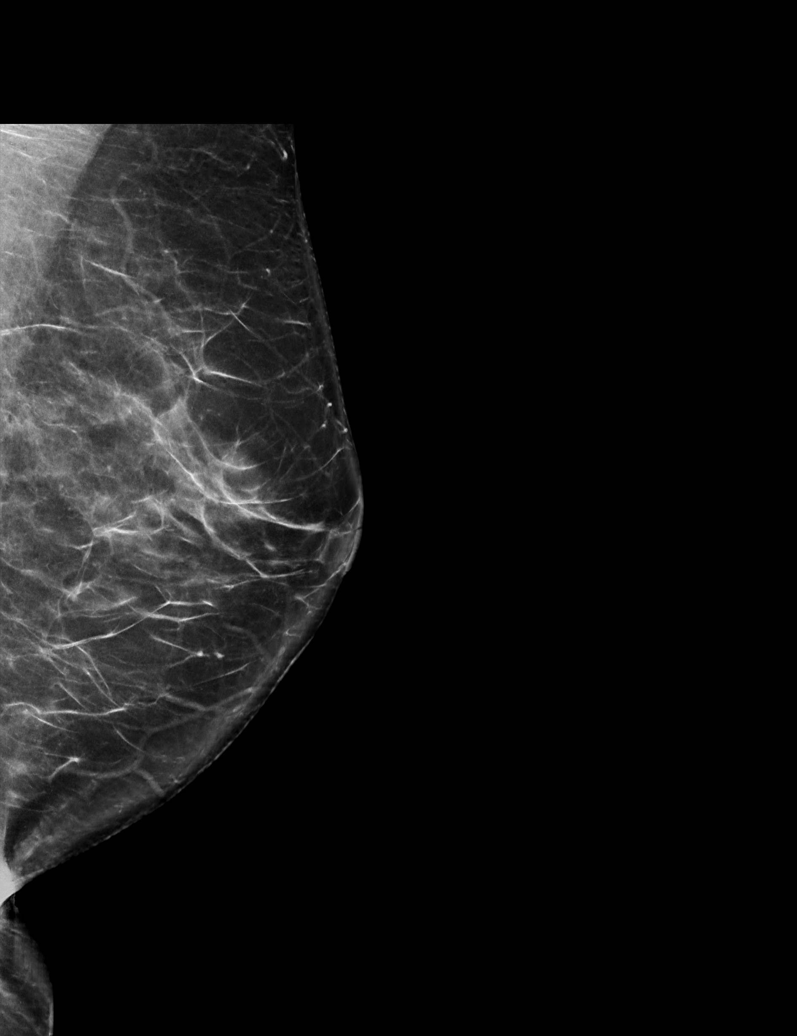

[R CC synth-2D (1 of 2)]
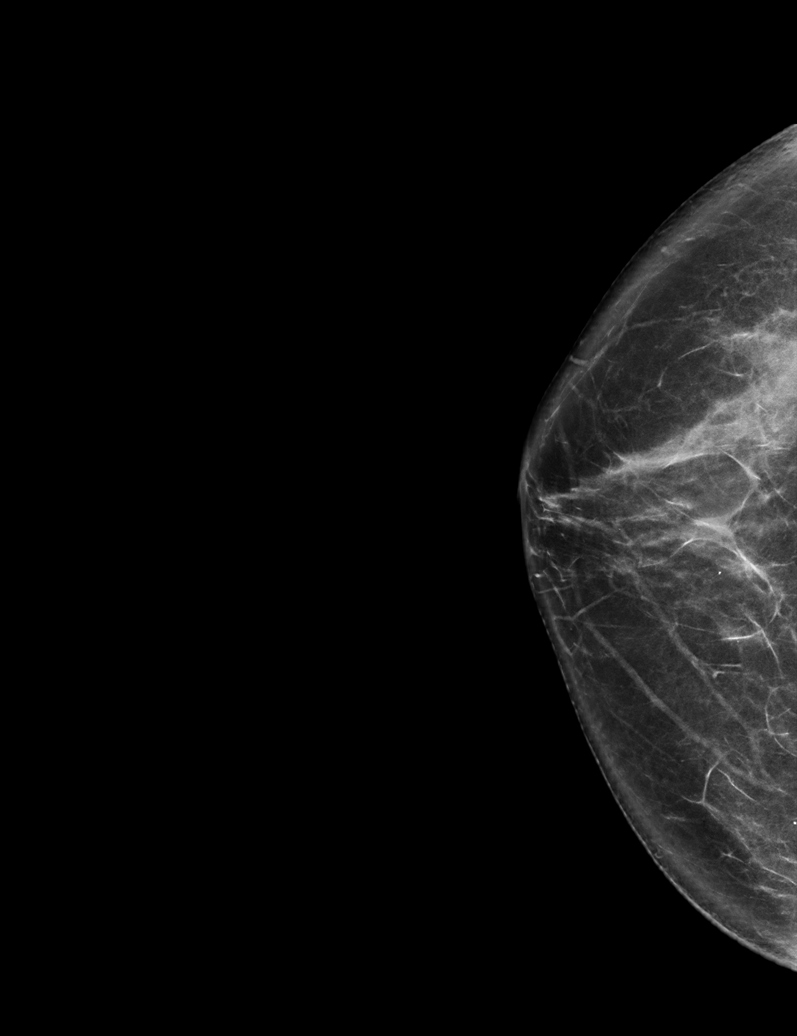

[R CC synth-2D (2 of 2)]
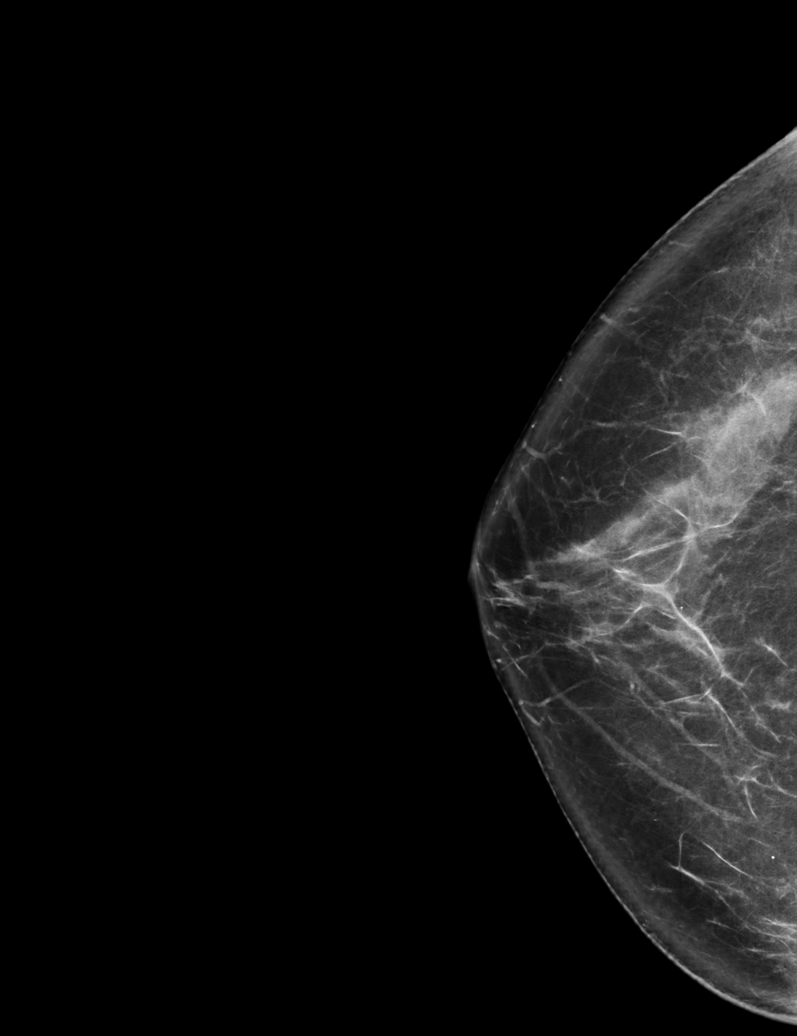

[L CC synth-2D]
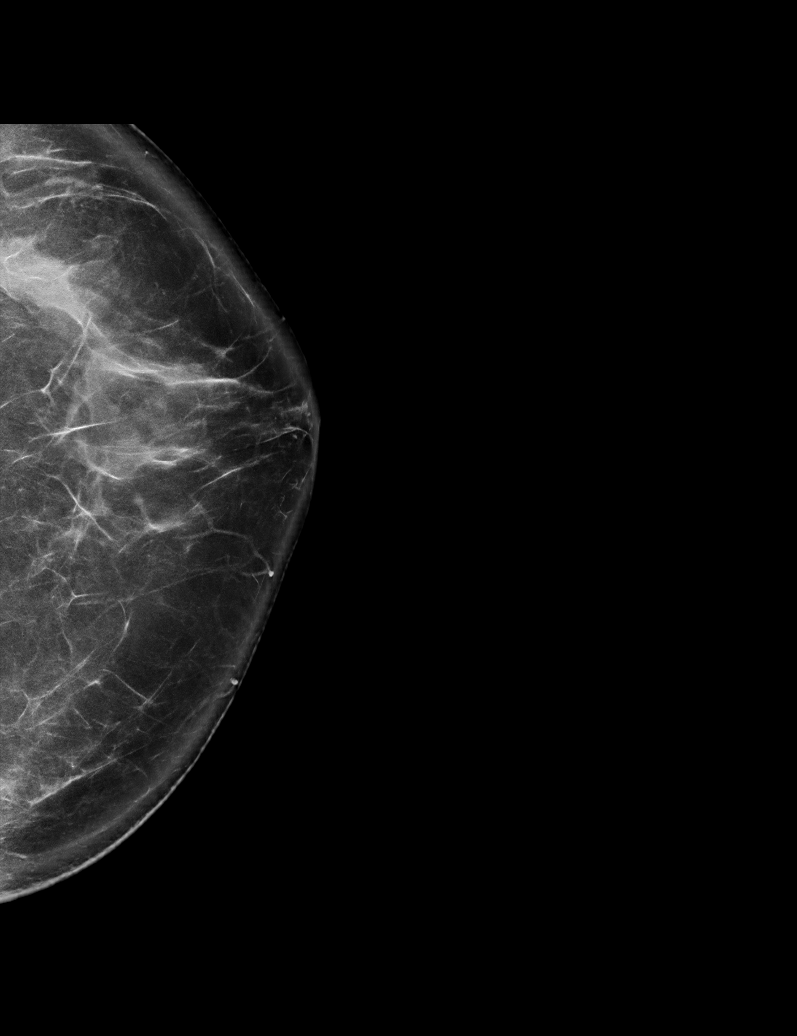

[R MLO tomo · tomo slice 39/78.0]
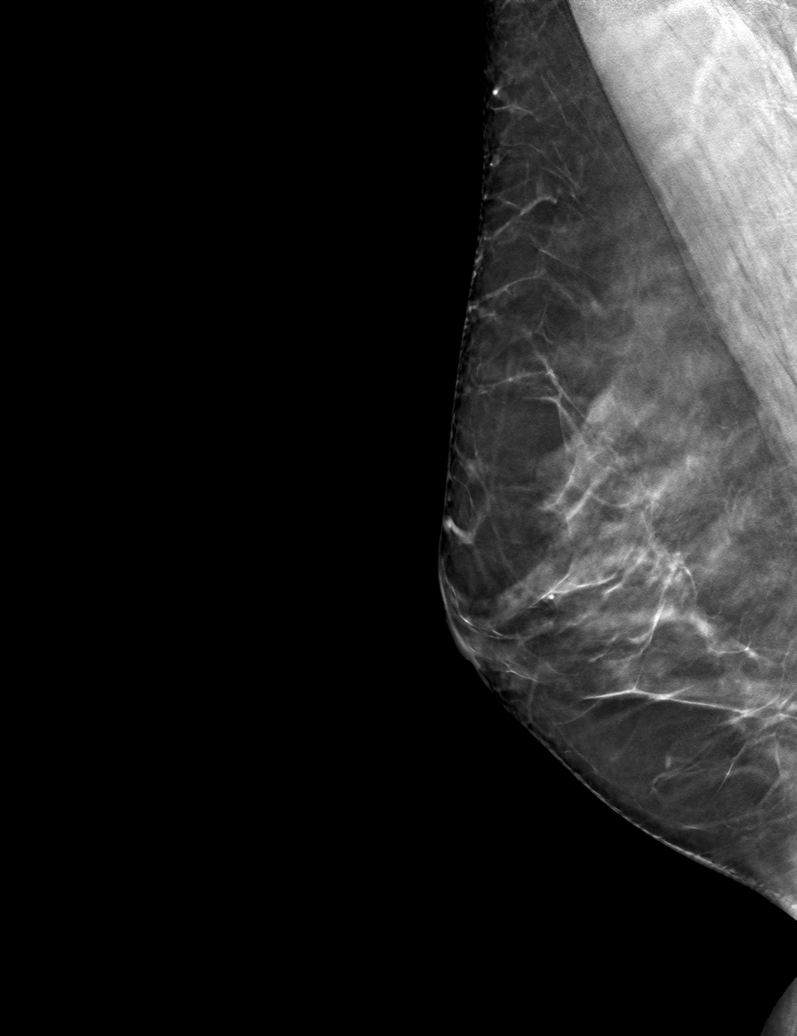

[6 of 30 positions shown; findings below may reference images not displayed]

ACR Breast Density Category c: The breast tissue is heterogeneously
dense, which may obscure small masses.
FINDINGS: There are no findings suspicious for malignancy.
IMPRESSION: No mammographic evidence of malignancy. A result letter of this
screening mammogram will be mailed directly to the patient.

RECOMMENDATION:
Screening mammogram in one year. (Code:Q3-W-BC3)

BI-RADS CATEGORY  1: Negative.

## 2023-01-26 DIAGNOSIS — Z0001 Encounter for general adult medical examination with abnormal findings: Secondary | ICD-10-CM | POA: Diagnosis not present

## 2023-01-27 ENCOUNTER — Other Ambulatory Visit: Payer: Self-pay | Admitting: Family Medicine

## 2023-01-27 DIAGNOSIS — Z1231 Encounter for screening mammogram for malignant neoplasm of breast: Secondary | ICD-10-CM

## 2023-03-16 ENCOUNTER — Ambulatory Visit
Admission: RE | Admit: 2023-03-16 | Discharge: 2023-03-16 | Disposition: A | Discharge status: Home / Self Care | Payer: BC Managed Care – PPO | Source: Ambulatory Visit | Attending: Family Medicine | Admitting: Family Medicine

## 2023-03-16 DIAGNOSIS — Z1231 Encounter for screening mammogram for malignant neoplasm of breast: Secondary | ICD-10-CM | POA: Diagnosis not present

## 2023-05-04 DIAGNOSIS — E063 Autoimmune thyroiditis: Secondary | ICD-10-CM | POA: Diagnosis not present

## 2023-05-04 DIAGNOSIS — Z0001 Encounter for general adult medical examination with abnormal findings: Secondary | ICD-10-CM | POA: Diagnosis not present

## 2023-05-04 DIAGNOSIS — F909 Attention-deficit hyperactivity disorder, unspecified type: Secondary | ICD-10-CM | POA: Diagnosis not present

## 2023-05-04 DIAGNOSIS — I10 Essential (primary) hypertension: Secondary | ICD-10-CM | POA: Diagnosis not present

## 2023-05-04 DIAGNOSIS — F4320 Adjustment disorder, unspecified: Secondary | ICD-10-CM | POA: Diagnosis not present

## 2023-05-04 DIAGNOSIS — R7301 Impaired fasting glucose: Secondary | ICD-10-CM | POA: Diagnosis not present

## 2023-05-04 DIAGNOSIS — R030 Elevated blood-pressure reading, without diagnosis of hypertension: Secondary | ICD-10-CM | POA: Diagnosis not present

## 2023-07-10 IMAGING — CR DG CHEST 2V
2 series · 2 of 2 positions shown · non-contrast
Comparison: None

CLINICAL DATA: Chest pain since last night, shortness of breath,
frequent PVCs

EXAM:
CHEST - 2 VIEW

[w chest pa]
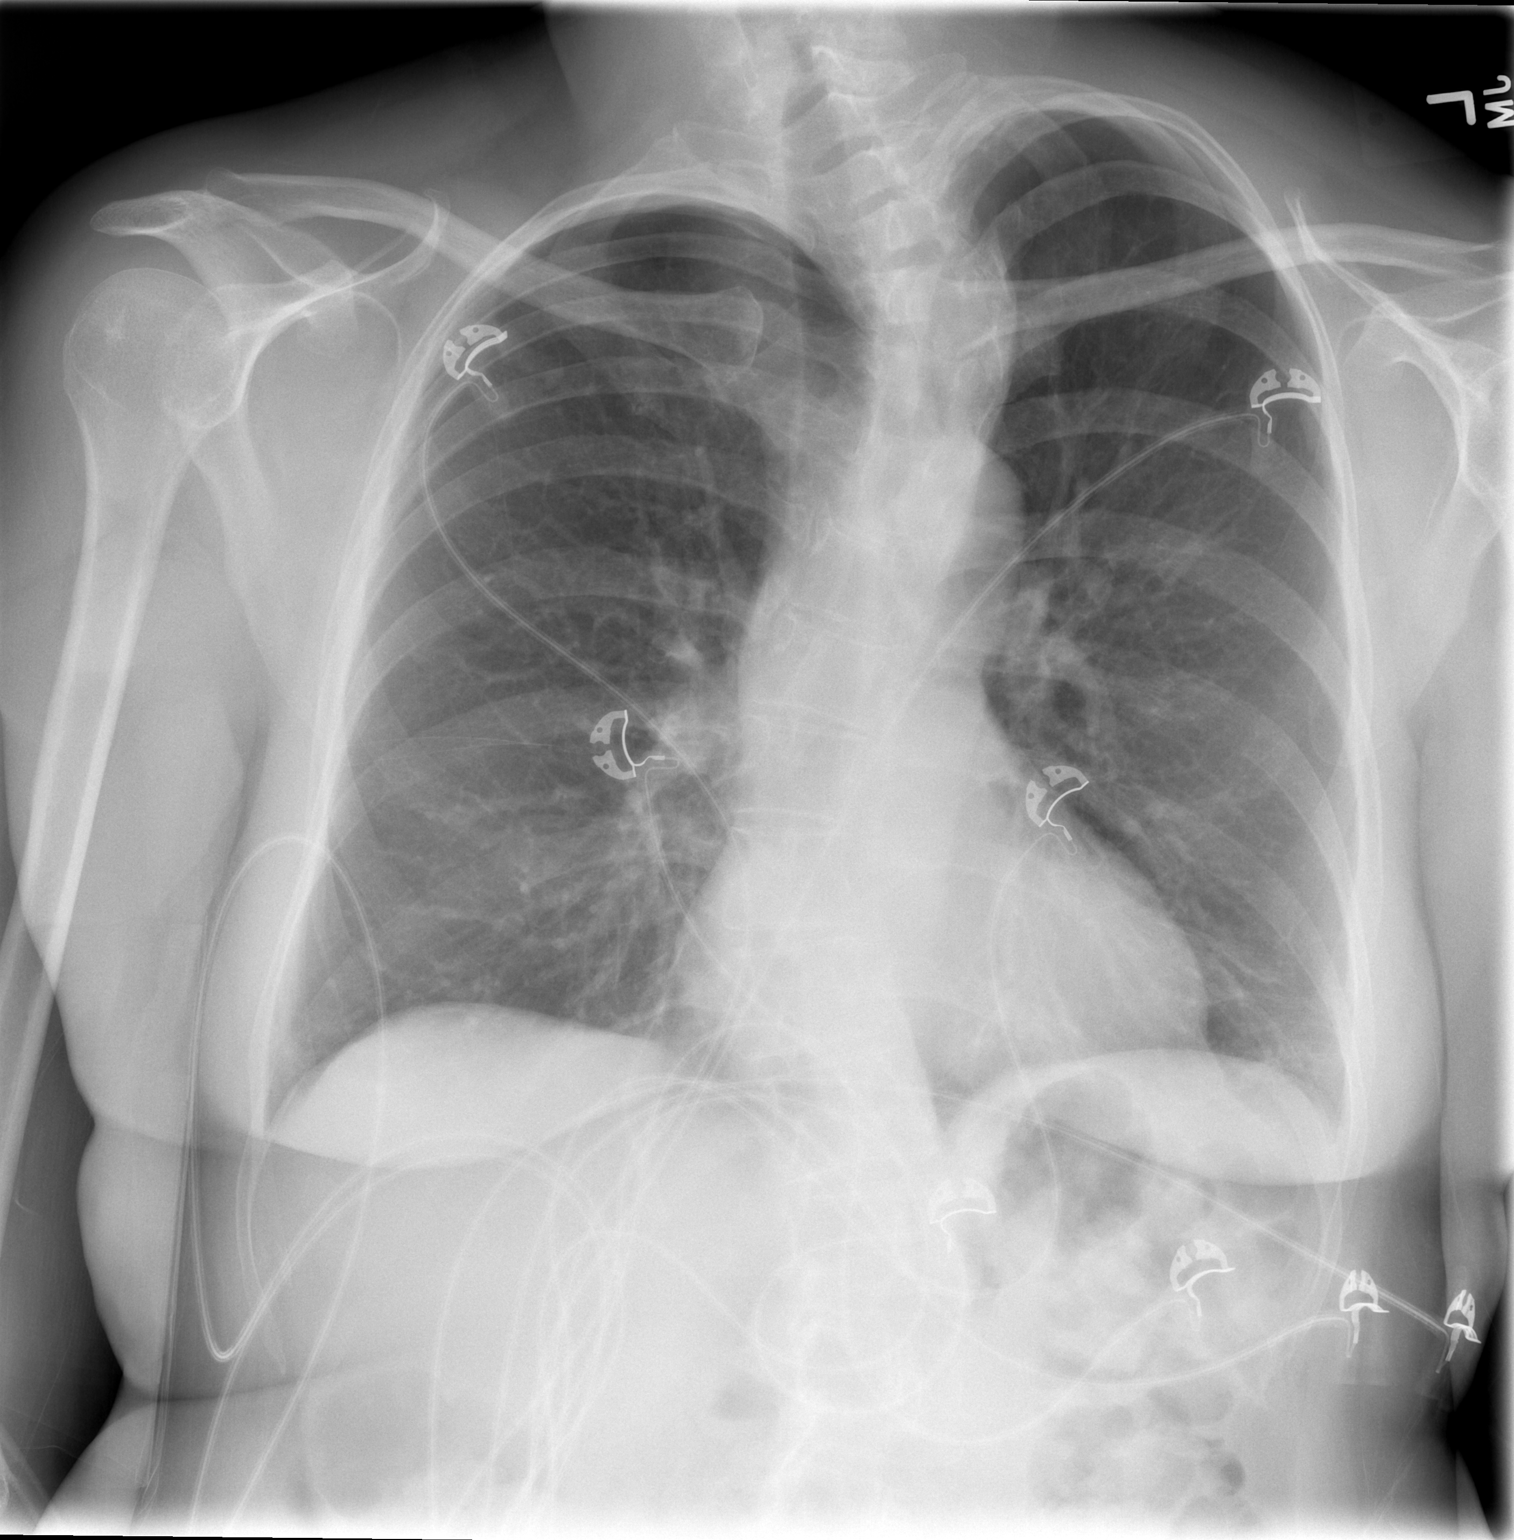

[w chest lat]
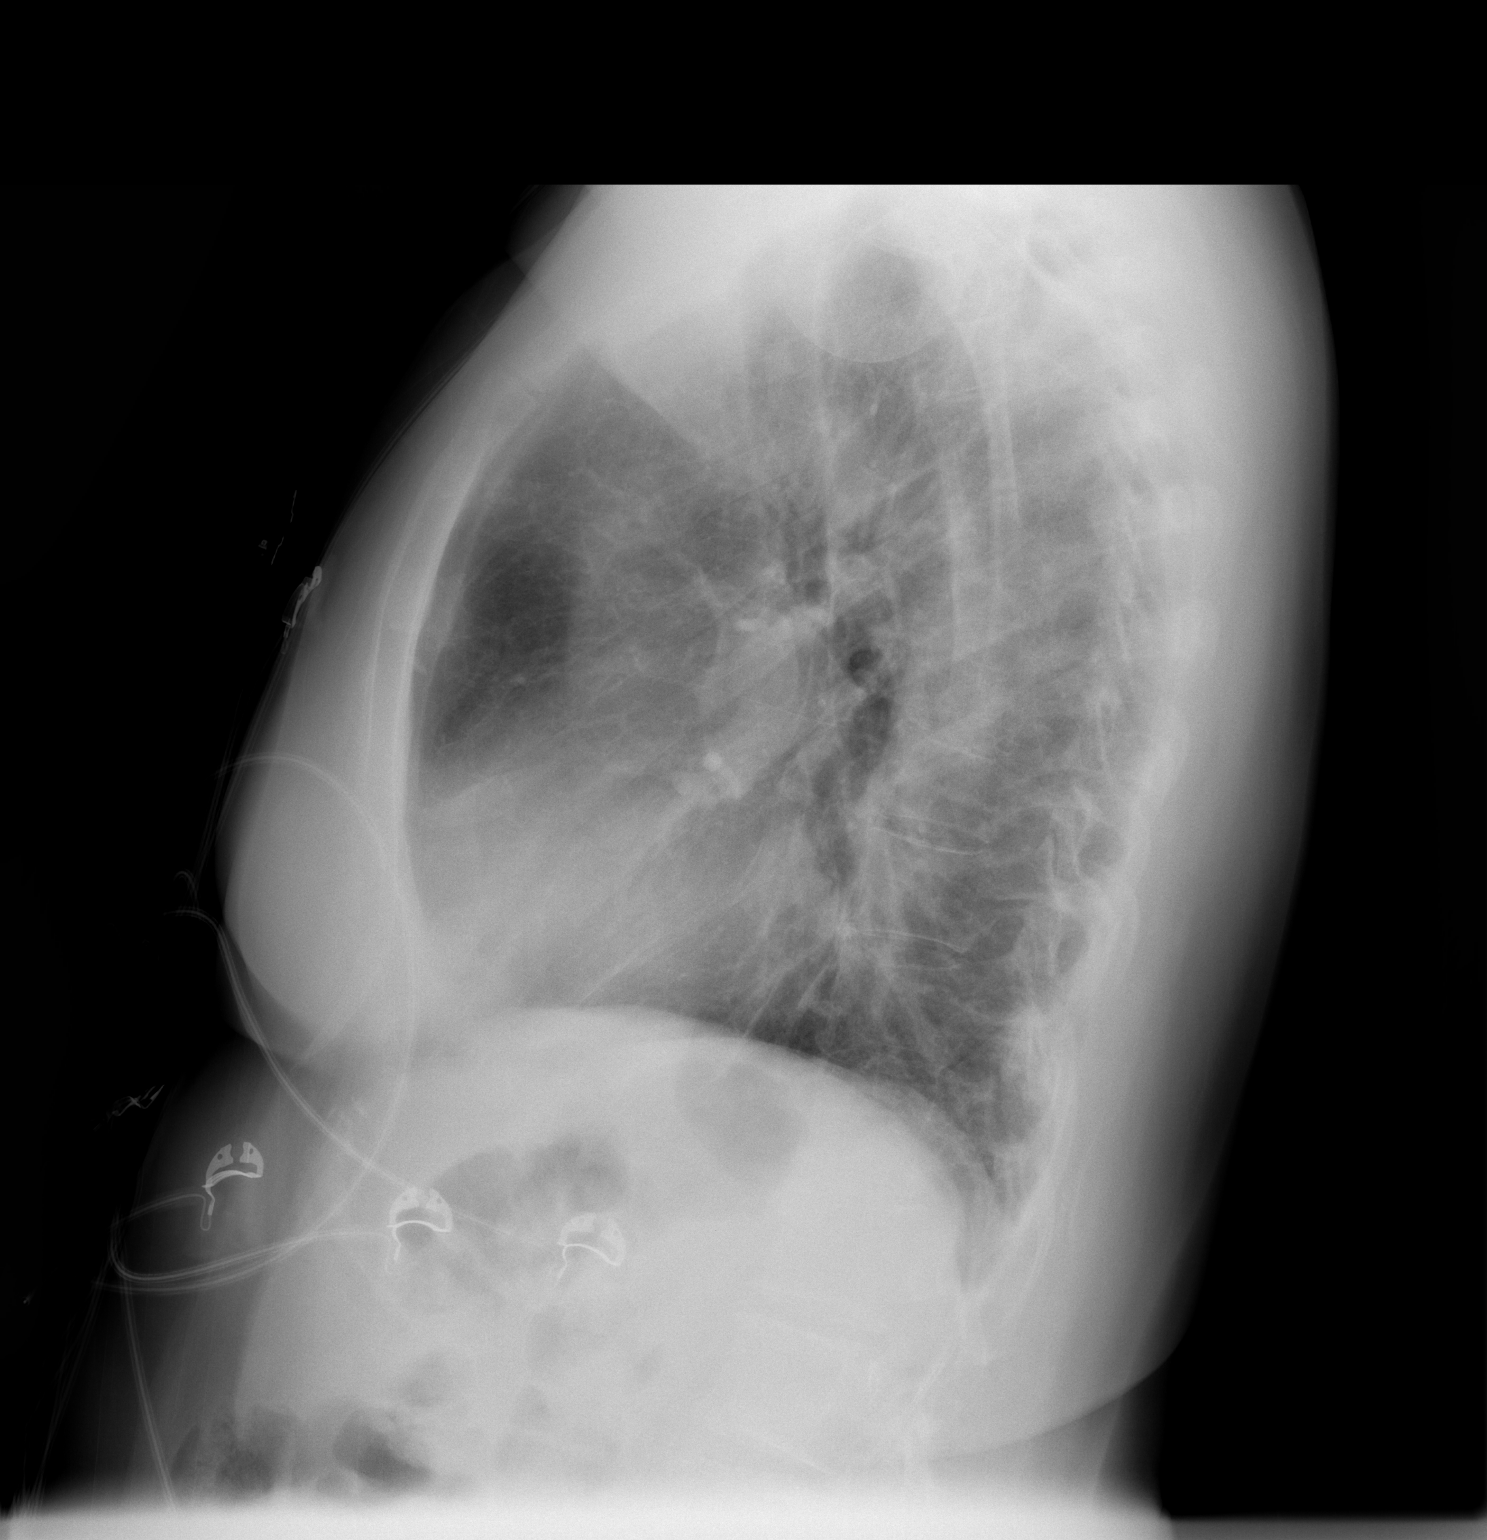

[2 of 2 positions shown; findings below may reference images not displayed]

FINDINGS: Normal heart size, mediastinal contours, and pulmonary vascularity.

Minimal LEFT basilar atelectasis.

Lungs otherwise clear.

No acute infiltrate, pleural effusion, or pneumothorax.

Biconvex thoracic scoliosis.
IMPRESSION: Minimal LEFT basilar atelectasis.

Scoliosis.

## 2023-08-03 DIAGNOSIS — Z23 Encounter for immunization: Secondary | ICD-10-CM | POA: Diagnosis not present

## 2023-08-03 DIAGNOSIS — N951 Menopausal and female climacteric states: Secondary | ICD-10-CM | POA: Diagnosis not present

## 2023-08-03 DIAGNOSIS — E349 Endocrine disorder, unspecified: Secondary | ICD-10-CM | POA: Diagnosis not present

## 2023-08-03 DIAGNOSIS — E281 Androgen excess: Secondary | ICD-10-CM | POA: Diagnosis not present

## 2023-08-03 DIAGNOSIS — F4320 Adjustment disorder, unspecified: Secondary | ICD-10-CM | POA: Diagnosis not present

## 2023-08-03 DIAGNOSIS — I10 Essential (primary) hypertension: Secondary | ICD-10-CM | POA: Diagnosis not present

## 2023-08-03 DIAGNOSIS — F909 Attention-deficit hyperactivity disorder, unspecified type: Secondary | ICD-10-CM | POA: Diagnosis not present

## 2023-08-03 DIAGNOSIS — Z7952 Long term (current) use of systemic steroids: Secondary | ICD-10-CM | POA: Diagnosis not present

## 2023-08-31 DIAGNOSIS — N951 Menopausal and female climacteric states: Secondary | ICD-10-CM | POA: Diagnosis not present

## 2023-08-31 DIAGNOSIS — F909 Attention-deficit hyperactivity disorder, unspecified type: Secondary | ICD-10-CM | POA: Diagnosis not present

## 2023-08-31 DIAGNOSIS — I10 Essential (primary) hypertension: Secondary | ICD-10-CM | POA: Diagnosis not present
